# Patient Record
Sex: Female | Born: 1935 | Race: White | Hispanic: No | Smoking: Former smoker
Health system: Southern US, Community
[De-identification: ages and names within clinical notes are randomized; demographics above are authoritative.]

## PROBLEM LIST (undated history)

## (undated) DIAGNOSIS — Z9889 Other specified postprocedural states: Secondary | ICD-10-CM

## (undated) DIAGNOSIS — E039 Hypothyroidism, unspecified: Secondary | ICD-10-CM

## (undated) DIAGNOSIS — R112 Nausea with vomiting, unspecified: Secondary | ICD-10-CM

## (undated) DIAGNOSIS — E785 Hyperlipidemia, unspecified: Secondary | ICD-10-CM

## (undated) DIAGNOSIS — M810 Age-related osteoporosis without current pathological fracture: Secondary | ICD-10-CM

## (undated) HISTORY — DX: Hyperlipidemia, unspecified: E78.5

## (undated) HISTORY — DX: Age-related osteoporosis without current pathological fracture: M81.0

## (undated) HISTORY — PX: LAPAROTOMY: SHX154

## (undated) HISTORY — PX: ABDOMINAL HYSTERECTOMY: SHX81

## (undated) HISTORY — PX: HEMORROIDECTOMY: SUR656

## (undated) HISTORY — PX: SHOULDER ARTHROSCOPY: SHX128

## (undated) HISTORY — PX: OPEN REDUCTION INTERNAL FIXATION (ORIF) HAND: SHX5991

---

## 2000-10-09 ENCOUNTER — Other Ambulatory Visit: Admission: RE | Admit: 2000-10-09 | Discharge: 2000-10-09 | Payer: Self-pay | Admitting: Family Medicine

## 2000-10-17 ENCOUNTER — Ambulatory Visit (HOSPITAL_COMMUNITY): Admission: RE | Admit: 2000-10-17 | Discharge: 2000-10-17 | Payer: Self-pay | Admitting: Family Medicine

## 2000-10-17 ENCOUNTER — Encounter: Payer: Self-pay | Admitting: Family Medicine

## 2001-03-03 ENCOUNTER — Encounter: Payer: Self-pay | Admitting: Family Medicine

## 2001-03-03 ENCOUNTER — Ambulatory Visit (HOSPITAL_COMMUNITY): Admission: RE | Admit: 2001-03-03 | Discharge: 2001-03-03 | Payer: Self-pay | Admitting: Family Medicine

## 2001-03-23 ENCOUNTER — Emergency Department (HOSPITAL_COMMUNITY): Admission: EM | Admit: 2001-03-23 | Discharge: 2001-03-24 | Payer: Self-pay | Admitting: *Deleted

## 2001-06-26 ENCOUNTER — Encounter: Payer: Self-pay | Admitting: Family Medicine

## 2001-06-26 ENCOUNTER — Other Ambulatory Visit: Admission: RE | Admit: 2001-06-26 | Discharge: 2001-06-26 | Payer: Self-pay | Admitting: Family Medicine

## 2001-06-26 ENCOUNTER — Ambulatory Visit (HOSPITAL_COMMUNITY): Admission: RE | Admit: 2001-06-26 | Discharge: 2001-06-26 | Payer: Self-pay | Admitting: Family Medicine

## 2001-10-26 ENCOUNTER — Encounter: Payer: Self-pay | Admitting: Family Medicine

## 2001-10-26 ENCOUNTER — Ambulatory Visit (HOSPITAL_COMMUNITY): Admission: RE | Admit: 2001-10-26 | Discharge: 2001-10-26 | Payer: Self-pay | Admitting: Family Medicine

## 2002-03-03 ENCOUNTER — Encounter: Payer: Self-pay | Admitting: Family Medicine

## 2002-03-03 ENCOUNTER — Ambulatory Visit (HOSPITAL_COMMUNITY): Admission: RE | Admit: 2002-03-03 | Discharge: 2002-03-03 | Payer: Self-pay | Admitting: Family Medicine

## 2003-03-11 ENCOUNTER — Ambulatory Visit (HOSPITAL_COMMUNITY): Admission: RE | Admit: 2003-03-11 | Discharge: 2003-03-11 | Payer: Self-pay | Admitting: Family Medicine

## 2003-03-11 ENCOUNTER — Encounter: Payer: Self-pay | Admitting: Family Medicine

## 2003-06-05 ENCOUNTER — Emergency Department (HOSPITAL_COMMUNITY): Admission: EM | Admit: 2003-06-05 | Discharge: 2003-06-05 | Payer: Self-pay | Admitting: Internal Medicine

## 2004-03-19 ENCOUNTER — Ambulatory Visit (HOSPITAL_COMMUNITY): Admission: RE | Admit: 2004-03-19 | Discharge: 2004-03-19 | Payer: Self-pay | Admitting: Family Medicine

## 2004-07-10 ENCOUNTER — Ambulatory Visit (HOSPITAL_COMMUNITY): Admission: RE | Admit: 2004-07-10 | Discharge: 2004-07-10 | Payer: Self-pay | Admitting: Family Medicine

## 2005-03-25 ENCOUNTER — Ambulatory Visit (HOSPITAL_COMMUNITY): Admission: RE | Admit: 2005-03-25 | Discharge: 2005-03-25 | Payer: Self-pay | Admitting: Family Medicine

## 2005-04-05 ENCOUNTER — Ambulatory Visit (HOSPITAL_COMMUNITY): Admission: RE | Admit: 2005-04-05 | Discharge: 2005-04-05 | Payer: Self-pay | Admitting: Family Medicine

## 2005-06-27 ENCOUNTER — Ambulatory Visit (HOSPITAL_COMMUNITY): Admission: RE | Admit: 2005-06-27 | Discharge: 2005-06-27 | Payer: Self-pay | Admitting: Family Medicine

## 2006-03-28 ENCOUNTER — Ambulatory Visit (HOSPITAL_COMMUNITY): Admission: RE | Admit: 2006-03-28 | Discharge: 2006-03-28 | Payer: Self-pay | Admitting: Family Medicine

## 2006-07-25 ENCOUNTER — Ambulatory Visit (HOSPITAL_COMMUNITY): Admission: RE | Admit: 2006-07-25 | Discharge: 2006-07-25 | Payer: Self-pay | Admitting: Family Medicine

## 2007-03-31 ENCOUNTER — Ambulatory Visit (HOSPITAL_COMMUNITY): Admission: RE | Admit: 2007-03-31 | Discharge: 2007-03-31 | Payer: Self-pay | Admitting: Family Medicine

## 2008-04-22 ENCOUNTER — Ambulatory Visit (HOSPITAL_COMMUNITY): Admission: RE | Admit: 2008-04-22 | Discharge: 2008-04-22 | Payer: Self-pay | Admitting: Family Medicine

## 2009-05-01 ENCOUNTER — Ambulatory Visit (HOSPITAL_COMMUNITY): Admission: RE | Admit: 2009-05-01 | Discharge: 2009-05-01 | Payer: Self-pay | Admitting: Family Medicine

## 2009-08-22 ENCOUNTER — Ambulatory Visit (HOSPITAL_COMMUNITY): Admission: RE | Admit: 2009-08-22 | Discharge: 2009-08-22 | Payer: Self-pay | Admitting: Family Medicine

## 2009-09-01 ENCOUNTER — Ambulatory Visit (HOSPITAL_COMMUNITY)
Admission: RE | Admit: 2009-09-01 | Discharge: 2009-09-01 | Payer: Self-pay | Source: Home / Self Care | Admitting: Family Medicine

## 2009-09-06 ENCOUNTER — Ambulatory Visit: Payer: Self-pay | Admitting: Orthopedic Surgery

## 2009-09-06 DIAGNOSIS — M7710 Lateral epicondylitis, unspecified elbow: Secondary | ICD-10-CM | POA: Insufficient documentation

## 2009-09-06 DIAGNOSIS — M7580 Other shoulder lesions, unspecified shoulder: Secondary | ICD-10-CM

## 2009-09-06 DIAGNOSIS — M25529 Pain in unspecified elbow: Secondary | ICD-10-CM | POA: Insufficient documentation

## 2009-09-06 DIAGNOSIS — M25519 Pain in unspecified shoulder: Secondary | ICD-10-CM

## 2010-05-04 ENCOUNTER — Ambulatory Visit (HOSPITAL_COMMUNITY): Admission: RE | Admit: 2010-05-04 | Discharge: 2010-05-04 | Payer: Self-pay | Admitting: Family Medicine

## 2010-07-08 ENCOUNTER — Encounter: Payer: Self-pay | Admitting: Family Medicine

## 2010-07-17 NOTE — Letter (Signed)
Summary: History form  History form   Imported By: Jacklynn Ganong 09/13/2009 08:51:13  _____________________________________________________________________  External Attachment:    Type:   Image     Comment:   External Document

## 2010-07-17 NOTE — Medication Information (Signed)
Summary: Tax adviser   Imported By: Cammie Sickle 10/07/2009 12:21:32  _____________________________________________________________________  External Attachment:    Type:   Image     Comment:   External Document

## 2010-07-17 NOTE — Assessment & Plan Note (Signed)
Summary: LT FOREARM/ELBOW PAIN/NEEDS XRAYS/MEDICARE,GERBER/CAF   Vital Signs:  Patient profile:   75 year old female Height:      64 inches Weight:      168 pounds Pulse rate:   72 / minute Resp:     16 per minute  Vitals Entered By: Fuller Canada MD (September 06, 2009 10:15 AM)  Visit Type:  new patient Referring Provider:  self Primary Provider:  Dr. Renard Matter  CC:  left arm pain.  History of Present Illness: 75 years old LEFT arm and shoulder pain status post O. TIF of the wrist in 1980 present for 3 weeks of pain in the LEFT shoulder and elbow and generally in the LEFT arm.  She describes sharp throbbing constant severe pain an 8/10 grade which came on suddenly after some yard work   Will have xrays today.  Meds: Evista, Cretor, Vicodin as needed.    Allergies (verified): No Known Drug Allergies  Past History:  Past Medical History: OA cholesterol holes in ear drums  Past Surgical History: left arm broken right arm frozen shoulder cholesterol  Family History: na  Social History: Patient is divorced.  Patient is widowed.  no smoking no alcohol 3 cups of caffeine per day  Review of Systems Constitutional:  Denies weight loss, weight gain, fever, chills, and fatigue. Cardiovascular:  Denies chest pain, palpitations, fainting, and murmurs. Respiratory:  Complains of snoring; denies short of breath, wheezing, couch, tightness, pain on inspiration, and snoring . Gastrointestinal:  Complains of heartburn; denies nausea, vomiting, diarrhea, constipation, and blood in your stools. Genitourinary:  Denies frequency, urgency, difficulty urinating, painful urination, flank pain, and bleeding in urine. Neurologic:  Denies numbness, tingling, unsteady gait, dizziness, tremors, and seizure. Musculoskeletal:  Complains of stiffness and muscle pain; denies joint pain, swelling, instability, redness, and heat. Endocrine:  Denies excessive thirst, exessive urination, and  heat or cold intolerance. Psychiatric:  Denies nervousness, depression, anxiety, and hallucinations. Skin:  Denies changes in the skin, poor healing, rash, itching, and redness. HEENT:  Denies blurred or double vision, eye pain, redness, and watering; ear aches. Immunology:  Denies seasonal allergies, sinus problems, and allergic to bee stings. Hemoatologic:  Denies easy bleeding and brusing.  Physical Exam  Additional Exam:   VS reviewed and were normal  GEN: appearance was normal   CDV: normal pulses temperature and no edema  LYMPH nodes were normal   SKIN was normal   Neuro: normal sensation Psyche: AAO x 3 and mood was normal   MSK *Gait was normal  *Inspection LEFT arm and shoulder and elbow all examined at the same time.  There is tenderness over the lateral epicondyle of the LEFT elbow.  There is pain with supination against resistance.  There is mild impingement sign.  There is no tenderness around the shoulder.  Range of motion shoulder elbow wrist normal  Strength normal  Shoulder elbow stable wrist stable  No tenderness over the incision of the LEFT forearm.    Impression & Recommendations:  Problem # 1:  IMPINGEMENT SYNDROME (ICD-726.2)  Orders: New Patient Level III (60454) Shoulder x-ray,  minimum 2 views (09811)  Problem # 2:  SHOULDER PAIN (ICD-719.41)  2 views LEFT shoulder.  Glenohumeral joint looks relatively normal.  She does have some humeral proximal migration which I think is secondary to pain and angle of the x-ray.  Acromion looks okay  Impression normal shoulder.  Orders: New Patient Level III (91478) Shoulder x-ray,  minimum 2 views (29562)  Problem # 3:  LATERAL EPICONDYLITIS (ICD-726.32)  Orders: New Patient Level III (16109)  Problem # 4:  ELBOW PAIN, LEFT (ICD-719.42)  she has some lateral epicondylitis which seems to be the primary area of maximal tenderness and she has a mild shoulder impingement  Recommend 12 a Dosepak  followed by ibuprofen 800 mg t.i.d. for 16 days followup one month  Orders: New Patient Level III (60454)  Patient Instructions: 1)  take medication,  rest ! 2)  Please schedule a follow-up appointment in 1 month.

## 2010-10-23 ENCOUNTER — Other Ambulatory Visit (HOSPITAL_COMMUNITY): Payer: Self-pay | Admitting: Family Medicine

## 2010-10-23 ENCOUNTER — Ambulatory Visit (HOSPITAL_COMMUNITY)
Admission: RE | Admit: 2010-10-23 | Discharge: 2010-10-23 | Disposition: A | Discharge status: Home / Self Care | Payer: Medicare Other | Source: Ambulatory Visit | Attending: Family Medicine | Admitting: Family Medicine

## 2010-10-23 DIAGNOSIS — M79609 Pain in unspecified limb: Secondary | ICD-10-CM | POA: Insufficient documentation

## 2010-10-23 DIAGNOSIS — M79641 Pain in right hand: Secondary | ICD-10-CM

## 2011-03-26 ENCOUNTER — Other Ambulatory Visit (HOSPITAL_COMMUNITY): Payer: Self-pay | Admitting: Family Medicine

## 2011-03-26 DIAGNOSIS — Z139 Encounter for screening, unspecified: Secondary | ICD-10-CM

## 2011-05-10 ENCOUNTER — Ambulatory Visit (HOSPITAL_COMMUNITY)
Admission: RE | Admit: 2011-05-10 | Discharge: 2011-05-10 | Disposition: A | Discharge status: Home / Self Care | Payer: Medicare Other | Source: Ambulatory Visit | Attending: Family Medicine | Admitting: Family Medicine

## 2011-05-10 DIAGNOSIS — Z1231 Encounter for screening mammogram for malignant neoplasm of breast: Secondary | ICD-10-CM | POA: Insufficient documentation

## 2011-05-10 DIAGNOSIS — Z139 Encounter for screening, unspecified: Secondary | ICD-10-CM

## 2012-03-10 ENCOUNTER — Other Ambulatory Visit (HOSPITAL_COMMUNITY): Payer: Self-pay | Admitting: Family Medicine

## 2012-03-10 DIAGNOSIS — M810 Age-related osteoporosis without current pathological fracture: Secondary | ICD-10-CM

## 2012-03-17 ENCOUNTER — Other Ambulatory Visit (HOSPITAL_COMMUNITY): Payer: 59

## 2012-04-08 ENCOUNTER — Other Ambulatory Visit (HOSPITAL_COMMUNITY): Payer: Self-pay | Admitting: Family Medicine

## 2012-04-08 DIAGNOSIS — Z139 Encounter for screening, unspecified: Secondary | ICD-10-CM

## 2012-04-08 DIAGNOSIS — IMO0001 Reserved for inherently not codable concepts without codable children: Secondary | ICD-10-CM

## 2012-05-11 ENCOUNTER — Inpatient Hospital Stay (HOSPITAL_COMMUNITY): Admission: RE | Admit: 2012-05-11 | Payer: 59 | Source: Ambulatory Visit

## 2012-05-22 ENCOUNTER — Ambulatory Visit (HOSPITAL_COMMUNITY): Payer: 59

## 2012-05-29 ENCOUNTER — Ambulatory Visit (HOSPITAL_COMMUNITY): Payer: 59

## 2012-06-12 ENCOUNTER — Ambulatory Visit (HOSPITAL_COMMUNITY): Payer: 59

## 2012-06-15 ENCOUNTER — Ambulatory Visit (HOSPITAL_COMMUNITY): Payer: 59

## 2012-09-21 ENCOUNTER — Ambulatory Visit (HOSPITAL_COMMUNITY)
Admission: RE | Admit: 2012-09-21 | Discharge: 2012-09-21 | Disposition: A | Discharge status: Home / Self Care | Payer: Medicare Other | Source: Ambulatory Visit | Attending: Family Medicine | Admitting: Family Medicine

## 2012-09-21 DIAGNOSIS — Z139 Encounter for screening, unspecified: Secondary | ICD-10-CM

## 2012-09-21 DIAGNOSIS — Z1231 Encounter for screening mammogram for malignant neoplasm of breast: Secondary | ICD-10-CM | POA: Insufficient documentation

## 2012-11-11 ENCOUNTER — Encounter: Payer: Self-pay | Admitting: Obstetrics & Gynecology

## 2012-11-11 ENCOUNTER — Ambulatory Visit (INDEPENDENT_AMBULATORY_CARE_PROVIDER_SITE_OTHER): Payer: Medicare Other | Admitting: Obstetrics & Gynecology

## 2012-11-11 VITALS — BP 150/80 | Ht 62.0 in | Wt 155.0 lb

## 2012-11-11 DIAGNOSIS — K469 Unspecified abdominal hernia without obstruction or gangrene: Secondary | ICD-10-CM | POA: Insufficient documentation

## 2012-11-11 DIAGNOSIS — N815 Vaginal enterocele: Secondary | ICD-10-CM

## 2012-11-11 NOTE — Progress Notes (Signed)
Patient ID: Michelle Bradford, female   DOB: Dec 21, 1935, 77 y.o.   MRN: [id number] Referred from Dr Megan Mans  "feels like something falling out" worse for some time  Exam Enterocoele moderate, good vaginal apex support Good bladder and bladder neck and rectal support Short vagina Obviously post menopausal atrophy  Milex ring with support fitted  Follow up in 1 week

## 2012-11-23 ENCOUNTER — Encounter: Payer: Self-pay | Admitting: Obstetrics & Gynecology

## 2012-11-23 ENCOUNTER — Ambulatory Visit (INDEPENDENT_AMBULATORY_CARE_PROVIDER_SITE_OTHER): Payer: Medicare Other | Admitting: Obstetrics & Gynecology

## 2012-11-23 VITALS — BP 150/80 | Wt 150.0 lb

## 2012-11-23 DIAGNOSIS — K469 Unspecified abdominal hernia without obstruction or gangrene: Secondary | ICD-10-CM

## 2012-11-23 DIAGNOSIS — N815 Vaginal enterocele: Secondary | ICD-10-CM

## 2012-11-23 NOTE — Progress Notes (Signed)
Patient ID: Michelle Bradford, female   DOB: 08-Dec-1935, 77 y.o.   MRN: [id number] Milex ring with support 2 1/2 inches #3 placed for patient

## 2012-12-24 ENCOUNTER — Ambulatory Visit: Payer: Medicare Other | Admitting: Obstetrics & Gynecology

## 2012-12-29 ENCOUNTER — Ambulatory Visit: Payer: Medicare Other | Admitting: Obstetrics & Gynecology

## 2013-01-05 ENCOUNTER — Encounter: Payer: Self-pay | Admitting: Obstetrics & Gynecology

## 2013-01-05 ENCOUNTER — Ambulatory Visit (INDEPENDENT_AMBULATORY_CARE_PROVIDER_SITE_OTHER): Payer: Medicare Other | Admitting: Obstetrics & Gynecology

## 2013-01-05 VITALS — BP 160/80 | Wt 154.0 lb

## 2013-01-05 DIAGNOSIS — K469 Unspecified abdominal hernia without obstruction or gangrene: Secondary | ICD-10-CM

## 2013-01-05 DIAGNOSIS — N815 Vaginal enterocele: Secondary | ICD-10-CM

## 2013-01-05 MED ORDER — NYSTATIN-TRIAMCINOLONE 100000-0.1 UNIT/GM-% EX OINT: TOPICAL_OINTMENT | Freq: Two times a day (BID) | CUTANEOUS | Status: DC

## 2013-01-05 NOTE — Progress Notes (Signed)
Patient ID: Michelle Bradford, female   DOB: 09-10-1935, 77 y.o.   MRN: [id number] Pt having adverse symptoms with the pessary It is not a great fit for her due to short vagina  Removed and not replaced  Follow up in 6 months  GV placed Mytrex Rx

## 2013-01-05 NOTE — Patient Instructions (Signed)
Yeast Infection of the Skin Some yeast on the skin is normal, but sometimes it causes an infection. If you have a yeast infection, it shows up as white or light brown patches on brown skin. You can see it better in the summer on tan skin. It causes light-colored holes in your suntan. It can happen on any area of the body. This cannot be passed from person to person. HOME CARE  Scrub your skin daily with a dandruff shampoo. Your rash may take a couple weeks to get well.  Do not scratch or itch the rash. GET HELP RIGHT AWAY IF:   You get another infection from scratching. The skin may get warm, red, and may ooze fluid.  The infection does not seem to be getting better. MAKE SURE YOU:  Understand these instructions.  Will watch your condition.  Will get help right away if you are not doing well or get worse. Document Released: 05/16/2008 Document Revised: 08/26/2011 Document Reviewed: 05/16/2008 Exit[employer] Patient Information 2014 [employer].  

## 2013-02-16 ENCOUNTER — Ambulatory Visit: Payer: Medicare Other | Admitting: Obstetrics & Gynecology

## 2013-05-20 ENCOUNTER — Emergency Department (HOSPITAL_COMMUNITY)
Admission: EM | Admit: 2013-05-20 | Discharge: 2013-05-20 | Disposition: A | Discharge status: Home / Self Care | Payer: Medicare Other | Attending: Emergency Medicine | Admitting: Emergency Medicine

## 2013-05-20 ENCOUNTER — Encounter (HOSPITAL_COMMUNITY): Payer: Self-pay | Admitting: Emergency Medicine

## 2013-05-20 ENCOUNTER — Emergency Department (HOSPITAL_COMMUNITY): Payer: Medicare Other

## 2013-05-20 DIAGNOSIS — M810 Age-related osteoporosis without current pathological fracture: Secondary | ICD-10-CM | POA: Insufficient documentation

## 2013-05-20 DIAGNOSIS — Y92009 Unspecified place in unspecified non-institutional (private) residence as the place of occurrence of the external cause: Secondary | ICD-10-CM | POA: Insufficient documentation

## 2013-05-20 DIAGNOSIS — W19XXXA Unspecified fall, initial encounter: Secondary | ICD-10-CM

## 2013-05-20 DIAGNOSIS — S0101XA Laceration without foreign body of scalp, initial encounter: Secondary | ICD-10-CM

## 2013-05-20 DIAGNOSIS — W1809XA Striking against other object with subsequent fall, initial encounter: Secondary | ICD-10-CM | POA: Insufficient documentation

## 2013-05-20 DIAGNOSIS — S0100XA Unspecified open wound of scalp, initial encounter: Secondary | ICD-10-CM | POA: Insufficient documentation

## 2013-05-20 DIAGNOSIS — Z23 Encounter for immunization: Secondary | ICD-10-CM | POA: Insufficient documentation

## 2013-05-20 DIAGNOSIS — Z79899 Other long term (current) drug therapy: Secondary | ICD-10-CM | POA: Insufficient documentation

## 2013-05-20 DIAGNOSIS — Y9389 Activity, other specified: Secondary | ICD-10-CM | POA: Insufficient documentation

## 2013-05-20 DIAGNOSIS — Z87891 Personal history of nicotine dependence: Secondary | ICD-10-CM | POA: Insufficient documentation

## 2013-05-20 DIAGNOSIS — IMO0002 Reserved for concepts with insufficient information to code with codable children: Secondary | ICD-10-CM | POA: Insufficient documentation

## 2013-05-20 DIAGNOSIS — E785 Hyperlipidemia, unspecified: Secondary | ICD-10-CM | POA: Insufficient documentation

## 2013-05-20 DIAGNOSIS — W230XXA Caught, crushed, jammed, or pinched between moving objects, initial encounter: Secondary | ICD-10-CM | POA: Insufficient documentation

## 2013-05-20 MED ORDER — ACETAMINOPHEN 325 MG PO TABS
650.0000 mg | ORAL_TABLET | Freq: Once | ORAL | Status: AC
  Administered 2013-05-20: 650 mg via ORAL
  Filled 2013-05-20: qty 2

## 2013-05-20 MED ORDER — TETANUS-DIPHTH-ACELL PERTUSSIS 5-2.5-18.5 LF-MCG/0.5 IM SUSP
0.5000 mL | Freq: Once | INTRAMUSCULAR | Status: AC
  Administered 2013-05-20: 0.5 mL via INTRAMUSCULAR
  Filled 2013-05-20: qty 0.5

## 2013-05-20 MED ORDER — LIDOCAINE-EPINEPHRINE-TETRACAINE (LET) SOLUTION
3.0000 mL | Freq: Once | NASAL | Status: AC
  Administered 2013-05-20: 3 mL via TOPICAL
  Filled 2013-05-20: qty 3

## 2013-05-20 NOTE — ED Provider Notes (Signed)
CSN: [id number]     Arrival date & time 05/20/13  0350 History   First MD Initiated Contact with Patient 05/20/13 [phone]     Chief Complaint  Patient presents with  . 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 She states she had gotten up to go to the bathroom and she caught her foot underneath the bed and fell between her bed and the TV and hit her head on the TV stand. She denies loss of consciousness. She states she did have nausea initially but it's gone now. She had no vomiting. She denies any blurred double vision. She denies any numbness or tingling in her extremities. She denies injuring anything else and specifically denies neck pain, back pain, chest pain, abdominal pain, or extremity pain. Patient denies taking blood thinners. She states she takes a baby aspirin now and then but not on a regular basis.She c/o a headache in the area where she hit her head.   Last tetanus years ago  PCP Dr Renard Matter  Past Medical History  Diagnosis Date  . Hyperlipidemia   . Osteoporosis    Past Surgical History  Procedure Laterality Date  . Abdominal hysterectomy     History reviewed. No pertinent family history. History  Substance Use Topics  . Smoking status: Former Games developer  . Smokeless tobacco: Not on file  . Alcohol Use: No   Lives at home Lives with a roommate   OB History   Grav Para Term Preterm Abortions TAB SAB Ect Mult Living                 Review of Systems  All other systems reviewed and are negative.    Allergies  Review of patient's allergies indicates no known allergies.  Home Medications   Current Outpatient Rx  Name  Route  Sig  Dispense  Refill  . HYDROcodone-acetaminophen (NORCO/VICODIN) 5-325 MG per tablet   Oral   Take 1 tablet by mouth every 4 (four) hours.         Marland Kitchen nystatin-triamcinolone  ointment (MYCOLOG)   Topical   Apply topically 2 (two) times daily.   30 g   11   . raloxifene (EVISTA) 60 MG tablet   Oral   Take 60 mg by mouth daily.         . rosuvastatin (CRESTOR) 20 MG tablet   Oral   Take 20 mg by mouth daily.          BP 155/73  Pulse 66  Temp(Src) 98 F (36.7 C) (Oral)  Resp 16  Ht 5\' 2"  (1.575 m)  Wt 149 lb (67.586 kg)  BMI 27.25 kg/m2  SpO2 98%  Vital signs normal   Physical Exam  Nursing note and vitals reviewed. Constitutional: She is oriented to person, place, and time. She appears well-developed and well-nourished.  Non-toxic appearance. She does not appear ill. No distress.  HENT:  Head: Normocephalic.    Right Ear: External ear normal.  Left Ear: External ear normal.  Nose: Nose normal. No mucosal edema or rhinorrhea.  Mouth/Throat: Oropharynx is clear and moist and mucous membranes are normal. No dental abscesses or uvula swelling.  Patient has a 1 cm laceration of her left posterior scalp. He has mild swelling in that area. There is no crepitance noted.  Eyes: Conjunctivae and EOM are normal. Pupils are equal, round, and reactive to  light.  Neck: Normal range of motion and full passive range of motion without pain. Neck supple.  Nontender cervical spine to palpation  Pulmonary/Chest: Effort normal. No respiratory distress. She has no rhonchi. She exhibits no tenderness and no crepitus.  Abdominal: Soft. Normal appearance. There is no tenderness.  Musculoskeletal: Normal range of motion. She exhibits no edema and no tenderness.  Moves all extremities well. Nontender thoracic and lumbar spine  Neurological: She is alert and oriented to person, place, and time. She has normal strength. No cranial nerve deficit.  Skin: Skin is warm, dry and intact. No rash noted. No erythema. No pallor.  Psychiatric: She has a normal mood and affect. Her speech is normal and behavior is normal. Her mood appears not anxious.    ED Course    Procedures (including critical care time)  Medications  acetaminophen (TYLENOL) tablet 650 mg (not administered)  lidocaine-EPINEPHrine-tetracaine (LET) solution (3 mLs Topical Given 05/20/13 0442)  Tdap (BOOSTRIX) injection 0.5 mL (0.5 mLs Intramuscular Given 05/20/13 0442)   LACERATION REPAIR Performed by: Devoria Albe L Authorized by: Ward Givens Consent: Verbal consent obtained. Risks and benefits: risks, benefits and alternatives were discussed Consent given by: patient Patient identity confirmed: provided demographic data Prepped and Draped in normal sterile fashion Wound explored   Laceration Location: Left posterior scalp  Laceration Length: 1 cm  No Foreign Bodies seen or palpated   Local anesthetic:LET  Anesthetic total: 3 ml  Amount of cleaning: standard  Skin closure: staples  Number of sutures: 2   Patient tolerance: Patient tolerated the procedure well with no immediate complications.    Labs Review Labs Reviewed - No data to display Imaging Review Ct Head Wo Contrast  05/20/2013   CLINICAL DATA:  Fall, head trauma, left posterior laceration  EXAM: CT HEAD WITHOUT CONTRAST  TECHNIQUE: Contiguous axial images were obtained from the base of the skull through the vertex without intravenous contrast.  COMPARISON:  None  FINDINGS: Subcortical and periventricular white matter hypodensities are noted, a nonspecific finding most often seen in the setting of chronic microangiopathic change. No CT evidence of an acute infarction. No intraparenchymal hemorrhage, mass, mass effect, or abnormal extra-axial fluid collection. The ventricles, cisterns, and sulci are normal in size, shape, and position. The visualized paranasal sinuses predominantly clear. Partially opacified left greater than right mastoid air cells. There is left posterior scalp swelling and laceration as seen on image 44/60. No underlying calvarial fracture. No radiopaque foreign body.  IMPRESSION: Left  posterolateral scalp swelling/ laceration. No underlying calvarial fracture or acute intracranial abnormality.  White matter changes as above, a nonspecific finding often seen in the setting of chronic microangiopathic change.   Electronically Signed   By: Jearld Lesch M.D.   On: 05/20/2013 05:12    EKG Interpretation   None       MDM   1. Fall at home, initial encounter   2. Laceration of scalp, initial encounter     Plan discharge   Devoria Albe, MD, Franz Dell, MD 05/20/13 [phone]

## 2013-05-20 NOTE — ED Notes (Signed)
Pt reports tripping over foot of her bed and striking her head when she fell.  Laceration noted to back of head.  Bleeding controlled at this time.  Pt also c/o mild pain in left side.

## 2013-05-20 NOTE — ED Notes (Signed)
Wound cleaned.  Mild hematoma noted.

## 2013-09-07 ENCOUNTER — Other Ambulatory Visit (HOSPITAL_COMMUNITY): Payer: Self-pay | Admitting: Family Medicine

## 2013-09-07 DIAGNOSIS — Z1231 Encounter for screening mammogram for malignant neoplasm of breast: Secondary | ICD-10-CM

## 2013-09-27 ENCOUNTER — Ambulatory Visit (HOSPITAL_COMMUNITY)
Admission: RE | Admit: 2013-09-27 | Discharge: 2013-09-27 | Disposition: A | Discharge status: Home / Self Care | Payer: Medicare Other | Source: Ambulatory Visit | Attending: Family Medicine | Admitting: Family Medicine

## 2013-09-27 DIAGNOSIS — Z1231 Encounter for screening mammogram for malignant neoplasm of breast: Secondary | ICD-10-CM

## 2014-03-21 ENCOUNTER — Other Ambulatory Visit (HOSPITAL_COMMUNITY): Payer: Self-pay | Admitting: Family Medicine

## 2014-03-21 ENCOUNTER — Ambulatory Visit (HOSPITAL_COMMUNITY)
Admission: RE | Admit: 2014-03-21 | Discharge: 2014-03-21 | Disposition: A | Discharge status: Home / Self Care | Payer: Medicare Other | Source: Ambulatory Visit | Attending: Family Medicine | Admitting: Family Medicine

## 2014-03-21 DIAGNOSIS — R509 Fever, unspecified: Secondary | ICD-10-CM | POA: Diagnosis not present

## 2014-03-21 DIAGNOSIS — R05 Cough: Secondary | ICD-10-CM

## 2014-03-21 DIAGNOSIS — R110 Nausea: Secondary | ICD-10-CM | POA: Insufficient documentation

## 2014-03-21 DIAGNOSIS — J439 Emphysema, unspecified: Secondary | ICD-10-CM | POA: Diagnosis not present

## 2014-03-21 DIAGNOSIS — R059 Cough, unspecified: Secondary | ICD-10-CM

## 2014-08-16 ENCOUNTER — Ambulatory Visit (INDEPENDENT_AMBULATORY_CARE_PROVIDER_SITE_OTHER): Payer: Medicare Other | Admitting: Orthopedic Surgery

## 2014-08-16 ENCOUNTER — Ambulatory Visit (INDEPENDENT_AMBULATORY_CARE_PROVIDER_SITE_OTHER): Payer: Medicare Other

## 2014-08-16 VITALS — BP 140/78 | Ht 62.0 in | Wt 157.0 lb

## 2014-08-16 DIAGNOSIS — M75101 Unspecified rotator cuff tear or rupture of right shoulder, not specified as traumatic: Secondary | ICD-10-CM

## 2014-08-16 DIAGNOSIS — M25511 Pain in right shoulder: Secondary | ICD-10-CM

## 2014-08-16 MED ORDER — ACETAMINOPHEN-CODEINE #3 300-30 MG PO TABS: 1.0000 | ORAL_TABLET | ORAL | Status: DC | PRN

## 2014-08-16 NOTE — Progress Notes (Signed)
  Patient ID: Michelle Bradford, female   DOB: [birth year], 80 y.o.   MRN: [id number] Chief Complaint  Patient presents with  . Shoulder Pain    right shoulder pain radiates down arm to hand, no known injury    History the patient presents with a four-week history of painful right shoulder with painful [place] elevation painful activities of daily living. This has "locked she feels that there is popping going on the pain radiates into her forearm and is worse with forward elevation. She had a history of surgery for frozen circumflex shoulders back and the 90s. Pain is 8 out of 10 no previous treatment  She says she's not having any other problems in her review of systems she reported as normal. She has a medical history of osteoporosis and thyroid disease  She listed no previous surgeries  Medications include 40 mg of simvastatin, 60 mg evista, and Synthroid. No allergies.  Family history of thyroid disease and osteoporosis in her social history revealedsmoking drinking or drug use  BP 140/78 mmHg  Ht 5\' 2"  (1.575 m)  Wt 157 lb (71.215 kg)  BMI 28.71 kg/m2 The patient is well-groomed. She is oriented 3. Her mood and affect are normal. She walks normally.  Her left shoulder has full range of motion negative apprehension normal strength. No tenderness or swelling skin is normal pulses are intact sensation is normal in the axilla and supraclavicular regions have no positive lymph nodes  The right shoulder has painful range of motion decreased range of motion. Acromial and bicipital groove tenderness. Apprehension negative cuff strength remains normal skin intact pulses good sensation normal lymph nodes negative  X-rays show no fracture or dislocation no bone lesions  Impression rotator cuff syndrome  Recommend injection subacromial space   Procedure note the subacromial injection shoulder RIGHT  Verbal consent was obtained to inject the  RIGHT   Shoulder  Timeout was completed to confirm the  injection site is a subacromial space of the  RIGHT  shoulder   Medication used Depo-Medrol 40 mg and lidocaine 1% 3 cc  Anesthesia was provided by ethyl chloride  The injection was performed in the RIGHT  posterior subacromial space. After pinning the skin with alcohol and anesthetized the skin with ethyl chloride the subacromial space was injected using a 20-gauge needle. There were no complications  Sterile dressing was applied.

## 2014-08-16 NOTE — Patient Instructions (Signed)
Home exercises   Joint Injection Care After Refer to this sheet in the next few days. These instructions provide you with information on caring for yourself after you have had a joint injection. Your caregiver also may give you more specific instructions. Your treatment has been planned according to current medical practices, but problems sometimes occur. Call your caregiver if you have any problems or questions after your procedure. After any type of joint injection, it is not uncommon to experience:  Soreness, swelling, or bruising around the injection site.  Mild numbness, tingling, or weakness around the injection site caused by the numbing medicine used before or with the injection. It also is possible to experience the following effects associated with the specific agent after injection:  Iodine-based contrast agents:  Allergic reaction (itching, hives, widespread redness, and swelling beyond the injection site).  Corticosteroids (These effects are rare.):  Allergic reaction.  Increased blood sugar levels (If you have diabetes and you notice that your blood sugar levels have increased, notify your caregiver).  Increased blood pressure levels.  Mood swings.  Hyaluronic acid in the use of viscosupplementation.  Temporary heat or redness.  Temporary rash and itching.  Increased fluid accumulation in the injected joint. These effects all should resolve within a day after your procedure.  HOME CARE INSTRUCTIONS  Limit yourself to light activity the day of your procedure. Avoid lifting heavy objects, bending, stooping, or twisting.  Take prescription or over-the-counter pain medication as directed by your caregiver.  You may apply ice to your injection site to reduce pain and swelling the day of your procedure. Ice may be applied 03-04 times:  Put ice in a plastic bag.  Place a towel between your skin and the bag.  Leave the ice on for no longer than 15-20 minutes each  time. SEEK IMMEDIATE MEDICAL CARE IF:   Pain and swelling get worse rather than better or extend beyond the injection site.  Numbness does not go away.  Blood or fluid continues to leak from the injection site.  You have chest pain.  You have swelling of your face or tongue.  You have trouble breathing or you become dizzy.  You develop a fever, chills, or severe tenderness at the injection site that last longer than 1 day. MAKE SURE YOU:  Understand these instructions.  Watch your condition.  Get help right away if you are not doing well or if you get worse. Document Released: 02/14/2011 Document Revised: 08/26/2011 Document Reviewed: 02/14/2011 Exit[employer] Patient Information 2015 [employer]. This information is not intended to replace advice given to you by your health care provider. Make sure you discuss any questions you have with your health care provider.  

## 2014-08-30 ENCOUNTER — Other Ambulatory Visit (HOSPITAL_COMMUNITY): Payer: Self-pay | Admitting: Family Medicine

## 2014-08-30 DIAGNOSIS — Z1231 Encounter for screening mammogram for malignant neoplasm of breast: Secondary | ICD-10-CM

## 2014-09-28 ENCOUNTER — Telehealth: Payer: Self-pay | Admitting: Orthopedic Surgery

## 2014-09-28 NOTE — Telephone Encounter (Signed)
Patient called to relay that her shoulder is not yet better, following injection 08/16/14.  I have scheduled her another appointment, first available, 10/25/14.  Please advise of any recommendations.  Patient [phone]

## 2014-09-29 NOTE — Telephone Encounter (Signed)
GOOD

## 2014-09-29 NOTE — Telephone Encounter (Signed)
Routing to Dr Harrison 

## 2014-10-21 ENCOUNTER — Ambulatory Visit (HOSPITAL_COMMUNITY)
Admission: RE | Admit: 2014-10-21 | Discharge: 2014-10-21 | Disposition: A | Discharge status: Home / Self Care | Payer: Medicare Other | Source: Ambulatory Visit | Attending: Family Medicine | Admitting: Family Medicine

## 2014-10-21 DIAGNOSIS — Z1231 Encounter for screening mammogram for malignant neoplasm of breast: Secondary | ICD-10-CM | POA: Diagnosis not present

## 2014-10-25 ENCOUNTER — Encounter: Payer: Self-pay | Admitting: Orthopedic Surgery

## 2014-10-25 ENCOUNTER — Ambulatory Visit (INDEPENDENT_AMBULATORY_CARE_PROVIDER_SITE_OTHER): Payer: Medicare Other | Admitting: Orthopedic Surgery

## 2014-10-25 VITALS — BP 142/71 | Ht 62.0 in | Wt 157.0 lb

## 2014-10-25 DIAGNOSIS — M75101 Unspecified rotator cuff tear or rupture of right shoulder, not specified as traumatic: Secondary | ICD-10-CM

## 2014-10-25 NOTE — Progress Notes (Signed)
Patient ID: Michelle Bradford, female   DOB: 1936-01-01, 79 y.o.   MRN: [id number] Recheck visit  Chief Complaint  Patient presents with  . Shoulder Pain    recurring Right shoulder pain    Status post injection home exercise program for presumed rotator cuff syndrome got initial relief of pain, then pain started to come back  Patient is a clicking popping sound with weakness when she extends her arm away from her body. She says she can't lift anything heavy  System review neurologic symptoms are denied neck pain denied  Exam she does have some tenderness in her trapezius but most of her pain and tenderness are in the rotator cuff area of the shoulder including the deltoid anterior interval rotator interval and anterior joint line, some pain in the posterior subacromial space. Weakness and painful drop test stability tests are normal scans intact good pulses normal sensation lymph nodes are negative  Recommend MRI to assess her rotator cuff tear for surgery orders she does need decompression  Repeat injection  Procedure note the subacromial injection shoulder RIGHT  Verbal consent was obtained to inject the  RIGHT   Shoulder  Timeout was completed to confirm the injection site is a subacromial space of the  RIGHT  shoulder   Medication used Depo-Medrol 40 mg and lidocaine 1% 3 cc  Anesthesia was provided by ethyl chloride  The injection was performed in the RIGHT  posterior subacromial space. After pinning the skin with alcohol and anesthetized the skin with ethyl chloride the subacromial space was injected using a 20-gauge needle. There were no complications  Sterile dressing was applied.   Note addendum patient wants to hold on MRI until she sees the results of this injection, call us if pain persists

## 2014-10-25 NOTE — Patient Instructions (Signed)
If injection does not help shoulder pain, call office and we will order MRI

## 2014-11-15 ENCOUNTER — Telehealth: Payer: Self-pay | Admitting: Orthopedic Surgery

## 2014-11-15 NOTE — Telephone Encounter (Signed)
MRI RIGHT SHOULDER   ROTATOR CUFF TEAR

## 2014-11-15 NOTE — Telephone Encounter (Signed)
Patient spoke with Dr Aline Brochure, states right shoulder is not any better after 2nd injection (at visit 10/25/14) -- states will probably need to move forward with MRI.  Last office note indicates: "Note: addendum patient wants to hold on MRI until she sees the results of this injection, call us if pain persists"  -  Patient also states she will need OPEN MRI. Please advise.  Ph# is [phone].

## 2014-11-16 ENCOUNTER — Other Ambulatory Visit: Payer: Self-pay | Admitting: *Deleted

## 2014-11-16 DIAGNOSIS — M75101 Unspecified rotator cuff tear or rupture of right shoulder, not specified as traumatic: Secondary | ICD-10-CM

## 2014-11-16 NOTE — Telephone Encounter (Signed)
PATIENT AWARE

## 2014-11-16 NOTE — Telephone Encounter (Signed)
MRI ORDERED  CALLED PATIENT, NO ANSWER, LEFT VM

## 2014-11-29 ENCOUNTER — Ambulatory Visit (HOSPITAL_COMMUNITY): Payer: Medicare Other

## 2015-04-25 NOTE — Patient Instructions (Signed)
Michelle Bradford  04/25/2015     @PREFPERIOPPHARMACY @   Your procedure is scheduled on 05/01/2015.  Report to [facility] at 6:15 A.M.  Call this number if you have problems the morning of surgery:  [phone]   Remember:  Do not eat food or drink liquids after midnight.  Take these medicines the morning of surgery with A SIP OF WATER Synthroid   Do not wear jewelry, make-up or nail polish.  Do not wear lotions, powders, or perfumes.  You may wear deodorant.  Do not shave 48 hours prior to surgery.  Men may shave face and neck.  Do not bring valuables to the hospital.  [facility] is not responsible for any belongings or valuables.  Contacts, dentures or bridgework may not be worn into surgery.  Leave your suitcase in the car.  After surgery it may be brought to your room.  For patients admitted to the hospital, discharge time will be determined by your treatment team.  Patients discharged the day of surgery will not be allowed to drive home.   Please read over the following fact sheets that you were given. Anesthesia Post-op Instructions     PATIENT INSTRUCTIONS POST-ANESTHESIA  IMMEDIATELY FOLLOWING SURGERY:  Do not drive or operate machinery for the first twenty four hours after surgery.  Do not make any important decisions for twenty four hours after surgery or while taking narcotic pain medications or sedatives.  If you develop intractable nausea and vomiting or a severe headache please notify your doctor immediately.  FOLLOW-UP:  Please make an appointment with your surgeon as instructed. You do not need to follow up with anesthesia unless specifically instructed to do so.  WOUND CARE INSTRUCTIONS (if applicable):  Keep a dry clean dressing on the anesthesia/puncture wound site if there is drainage.  Once the wound has quit draining you may leave it open to air.  Generally you should leave the bandage intact for twenty four hours unless there is drainage.  If the epidural  site drains for more than 36-48 hours please call the anesthesia department.  QUESTIONS?:  Please feel free to call your physician or the hospital operator if you have any questions, and they will be happy to assist you.       A cataract is a clouding of the lens of the eye. When a lens becomes cloudy, vision is reduced based on the degree and nature of the clouding. Surgery may be needed to improve vision. Surgery removes the cloudy lens and usually replaces it with a substitute lens (intraocular lens, IOL). LET YOUR EYE DOCTOR KNOW ABOUT:  Allergies to food or medicine.  Medicines taken including herbs, eye drops, over-the-counter medicines, and creams.  Use of steroids (by mouth or creams).  Previous problems with anesthetics or numbing medicine.  History of bleeding problems or blood clots.  Previous surgery.  Other health problems, including diabetes and kidney problems.  Possibility of pregnancy, if this applies. RISKS AND COMPLICATIONS  Infection.  Inflammation of the eyeball (endophthalmitis) that can spread to both eyes (sympathetic ophthalmia).  Poor wound healing.  If an IOL is inserted, it can later fall out of proper position. This is very uncommon.  Clouding of the part of your eye that holds an IOL in place. This is called an "after-cataract." These are uncommon but easily treated. BEFORE THE PROCEDURE  Do not eat or drink anything except small amounts of water for 8 to 12 before your surgery, or as  directed by your caregiver.  Unless you are told otherwise, continue any eye drops you have been prescribed.  Talk to your primary caregiver about all other medicines that you take (both prescription and nonprescription). In some cases, you may need to stop or change medicines near the time of your surgery. This is most important if you are taking blood-thinning medicine.Do not stop medicines unless you are told to do so.  Arrange for someone to drive you to and  from the procedure.  Do not put contact lenses in either eye on the day of your surgery. PROCEDURE There is more than one method for safely removing a cataract. Your doctor can explain the differences and help determine which is best for you. Phacoemulsification surgery is the most common form of cataract surgery.  An injection is given behind the eye or eye drops are given to make this a painless procedure.  A small cut (incision) is made on the edge of the clear, dome-shaped surface that covers the front of the eye (cornea).  A tiny probe is painlessly inserted into the eye. This device gives off ultrasound waves that soften and break up the cloudy center of the lens. This makes it easier for the cloudy lens to be removed by suction.  An IOL may be implanted.  The normal lens of the eye is covered by a clear capsule. Part of that capsule is intentionally left in the eye to support the IOL.  Your surgeon may or may not use stitches to close the incision. There are other forms of cataract surgery that require a larger incision and stitches to close the eye. This approach is taken in cases where the doctor feels that the cataract cannot be easily removed using phacoemulsification. AFTER THE PROCEDURE  When an IOL is implanted, it does not need care. It becomes a permanent part of your eye and cannot be seen or felt.  Your doctor will schedule follow-up exams to check on your progress.  Review your other medicines with your doctor to see which can be resumed after surgery.  Use eye drops or take medicine as prescribed by your doctor.   This information is not intended to replace advice given to you by your health care provider. Make sure you discuss any questions you have with your health care provider.   Document Released: 05/23/2011 Document Revised: 06/24/2014 Document Reviewed: 05/23/2011 Elsevier Interactive Patient Education [employer].

## 2015-04-26 ENCOUNTER — Encounter (HOSPITAL_COMMUNITY): Payer: Self-pay

## 2015-04-26 ENCOUNTER — Other Ambulatory Visit: Payer: Self-pay

## 2015-04-26 ENCOUNTER — Encounter (HOSPITAL_COMMUNITY)
Admission: RE | Admit: 2015-04-26 | Discharge: 2015-04-26 | Disposition: A | Discharge status: Home / Self Care | Payer: Medicare Other | Source: Ambulatory Visit | Attending: Ophthalmology | Admitting: Ophthalmology

## 2015-04-26 DIAGNOSIS — Z01818 Encounter for other preprocedural examination: Secondary | ICD-10-CM | POA: Insufficient documentation

## 2015-04-26 DIAGNOSIS — H2512 Age-related nuclear cataract, left eye: Secondary | ICD-10-CM | POA: Insufficient documentation

## 2015-04-26 HISTORY — DX: Hypothyroidism, unspecified: E03.9

## 2015-04-26 LAB — CBC
HEMATOCRIT: 38.9 % (ref 36.0–46.0)
Hemoglobin: 12.7 g/dL (ref 12.0–15.0)
MCH: 31.2 pg (ref 26.0–34.0)
MCHC: 32.6 g/dL (ref 30.0–36.0)
MCV: 95.6 fL (ref 78.0–100.0)
PLATELETS: 186 10*3/uL (ref 150–400)
RBC: 4.07 MIL/uL (ref 3.87–5.11)
RDW: 12.5 % (ref 11.5–15.5)
WBC: 6.5 10*3/uL (ref 4.0–10.5)

## 2015-04-26 LAB — BASIC METABOLIC PANEL
Anion gap: 6 (ref 5–15)
BUN: 14 mg/dL (ref 6–20)
CALCIUM: 9.2 mg/dL (ref 8.9–10.3)
CO2: 30 mmol/L (ref 22–32)
CREATININE: 0.74 mg/dL (ref 0.44–1.00)
Chloride: 107 mmol/L (ref 101–111)
GFR calc non Af Amer: >60 mL/min (ref >60)
Glucose, Bld: 92 mg/dL (ref 65–99)
Potassium: 4.4 mmol/L (ref 3.5–5.1)
Sodium: 143 mmol/L (ref 135–145)

## 2015-04-28 MED ORDER — PHENYLEPHRINE HCL 2.5 % OP SOLN
OPHTHALMIC | Status: AC
  Filled 2015-04-28: qty 15

## 2015-04-28 MED ORDER — NEOMYCIN-POLYMYXIN-DEXAMETH 3.5-10000-0.1 OP SUSP
OPHTHALMIC | Status: AC
  Filled 2015-04-28: qty 5

## 2015-04-28 MED ORDER — TETRACAINE HCL 0.5 % OP SOLN
OPHTHALMIC | Status: AC
  Filled 2015-04-28: qty 2

## 2015-04-28 MED ORDER — CYCLOPENTOLATE-PHENYLEPHRINE OP SOLN OPTIME - NO CHARGE
OPHTHALMIC | Status: AC
  Filled 2015-04-28: qty 2

## 2015-04-28 MED ORDER — LIDOCAINE HCL 3.5 % OP GEL
OPHTHALMIC | Status: AC
  Filled 2015-04-28: qty 1

## 2015-04-28 MED ORDER — LIDOCAINE HCL (PF) 1 % IJ SOLN
INTRAMUSCULAR | Status: AC
  Filled 2015-04-28: qty 2

## 2015-05-01 ENCOUNTER — Ambulatory Visit (HOSPITAL_COMMUNITY): Payer: Medicare Other | Admitting: Anesthesiology

## 2015-05-01 ENCOUNTER — Ambulatory Visit (HOSPITAL_COMMUNITY)
Admission: RE | Admit: 2015-05-01 | Discharge: 2015-05-01 | Disposition: A | Discharge status: Home / Self Care | Payer: Medicare Other | Source: Ambulatory Visit | Attending: Ophthalmology | Admitting: Ophthalmology

## 2015-05-01 ENCOUNTER — Encounter (HOSPITAL_COMMUNITY): Payer: Self-pay | Admitting: Ophthalmology

## 2015-05-01 ENCOUNTER — Encounter (HOSPITAL_COMMUNITY)
Admission: RE | Disposition: A | Discharge status: Home / Self Care | Payer: Self-pay | Source: Ambulatory Visit | Attending: Ophthalmology

## 2015-05-01 DIAGNOSIS — E039 Hypothyroidism, unspecified: Secondary | ICD-10-CM | POA: Diagnosis not present

## 2015-05-01 DIAGNOSIS — M1991 Primary osteoarthritis, unspecified site: Secondary | ICD-10-CM | POA: Insufficient documentation

## 2015-05-01 DIAGNOSIS — H25812 Combined forms of age-related cataract, left eye: Secondary | ICD-10-CM | POA: Diagnosis not present

## 2015-05-01 DIAGNOSIS — Z79899 Other long term (current) drug therapy: Secondary | ICD-10-CM | POA: Diagnosis not present

## 2015-05-01 DIAGNOSIS — Z87891 Personal history of nicotine dependence: Secondary | ICD-10-CM | POA: Insufficient documentation

## 2015-05-01 HISTORY — PX: CATARACT EXTRACTION W/PHACO: SHX586

## 2015-05-01 SURGERY — PHACOEMULSIFICATION, CATARACT, WITH IOL INSERTION
Anesthesia: Monitor Anesthesia Care | Site: Eye | Laterality: Left

## 2015-05-01 MED ORDER — FENTANYL CITRATE (PF) 100 MCG/2ML IJ SOLN
INTRAMUSCULAR | Status: AC
  Filled 2015-05-01: qty 2

## 2015-05-01 MED ORDER — MIDAZOLAM HCL 2 MG/2ML IJ SOLN
INTRAMUSCULAR | Status: AC
  Filled 2015-05-01: qty 2

## 2015-05-01 MED ORDER — TETRACAINE HCL 0.5 % OP SOLN
1.0000 [drp] | OPHTHALMIC | Status: AC
  Administered 2015-05-01 (×3): 1 [drp] via OPHTHALMIC

## 2015-05-01 MED ORDER — NEOMYCIN-POLYMYXIN-DEXAMETH 3.5-10000-0.1 OP SUSP
OPHTHALMIC | Status: DC | PRN
  Administered 2015-05-01: 2 [drp] via OPHTHALMIC

## 2015-05-01 MED ORDER — BSS IO SOLN
INTRAOCULAR | Status: DC | PRN
  Administered 2015-05-01: 15 mL via INTRAOCULAR

## 2015-05-01 MED ORDER — CYCLOPENTOLATE-PHENYLEPHRINE 0.2-1 % OP SOLN
1.0000 [drp] | OPHTHALMIC | Status: AC
  Administered 2015-05-01 (×3): 1 [drp] via OPHTHALMIC

## 2015-05-01 MED ORDER — LACTATED RINGERS IV SOLN
INTRAVENOUS | Status: DC
  Administered 2015-05-01: 1000 mL via INTRAVENOUS

## 2015-05-01 MED ORDER — EPINEPHRINE HCL 1 MG/ML IJ SOLN
INTRAOCULAR | Status: DC | PRN
  Administered 2015-05-01: 08:00:00

## 2015-05-01 MED ORDER — MIDAZOLAM HCL 2 MG/2ML IJ SOLN
1.0000 mg | INTRAMUSCULAR | Status: DC | PRN
  Administered 2015-05-01: 2 mg via INTRAVENOUS

## 2015-05-01 MED ORDER — LIDOCAINE HCL (PF) 1 % IJ SOLN
INTRAMUSCULAR | Status: DC | PRN
  Administered 2015-05-01: .6 mL

## 2015-05-01 MED ORDER — PROVISC 10 MG/ML IO SOLN
INTRAOCULAR | Status: DC | PRN
  Administered 2015-05-01: 0.85 mL via INTRAOCULAR

## 2015-05-01 MED ORDER — PHENYLEPHRINE HCL 2.5 % OP SOLN
1.0000 [drp] | OPHTHALMIC | Status: AC
Start: 2015-05-01 — End: 2015-05-01
  Administered 2015-05-01 (×3): 1 [drp] via OPHTHALMIC

## 2015-05-01 MED ORDER — POVIDONE-IODINE 5 % OP SOLN
OPHTHALMIC | Status: DC | PRN
  Administered 2015-05-01: 1 via OPHTHALMIC

## 2015-05-01 MED ORDER — EPINEPHRINE HCL 1 MG/ML IJ SOLN
INTRAMUSCULAR | Status: AC
  Filled 2015-05-01: qty 1

## 2015-05-01 MED ORDER — LIDOCAINE HCL 3.5 % OP GEL
1.0000 "application " | Freq: Once | OPHTHALMIC | Status: AC
  Administered 2015-05-01: 1 via OPHTHALMIC

## 2015-05-01 SURGICAL SUPPLY — 34 items
CAPSULAR TENSION RING-AMO (OPHTHALMIC RELATED) IMPLANT
CLOTH BEACON ORANGE TIMEOUT ST (SAFETY) ×2 IMPLANT
EYE SHIELD UNIVERSAL CLEAR (GAUZE/BANDAGES/DRESSINGS) ×2 IMPLANT
GLOVE BIO SURGEON STRL SZ 6.5 (GLOVE) IMPLANT
GLOVE BIO SURGEONS STRL SZ 6.5 (GLOVE)
GLOVE BIOGEL PI IND STRL 6.5 (GLOVE) IMPLANT
GLOVE BIOGEL PI IND STRL 7.0 (GLOVE) IMPLANT
GLOVE BIOGEL PI IND STRL 7.5 (GLOVE) IMPLANT
GLOVE BIOGEL PI INDICATOR 6.5 (GLOVE)
GLOVE BIOGEL PI INDICATOR 7.0 (GLOVE) ×4
GLOVE BIOGEL PI INDICATOR 7.5 (GLOVE)
GLOVE ECLIPSE 6.5 STRL STRAW (GLOVE) IMPLANT
GLOVE ECLIPSE 7.0 STRL STRAW (GLOVE) IMPLANT
GLOVE ECLIPSE 7.5 STRL STRAW (GLOVE) IMPLANT
GLOVE EXAM NITRILE LRG STRL (GLOVE) IMPLANT
GLOVE EXAM NITRILE MD LF STRL (GLOVE) IMPLANT
GLOVE SKINSENSE NS SZ6.5 (GLOVE)
GLOVE SKINSENSE NS SZ7.0 (GLOVE)
GLOVE SKINSENSE STRL SZ6.5 (GLOVE) IMPLANT
GLOVE SKINSENSE STRL SZ7.0 (GLOVE) IMPLANT
KIT VITRECTOMY (OPHTHALMIC RELATED) IMPLANT
PAD ARMBOARD 7.5X6 YLW CONV (MISCELLANEOUS) ×2 IMPLANT
PROC W NO LENS (INTRAOCULAR LENS)
PROC W SPEC LENS (INTRAOCULAR LENS)
PROCESS W NO LENS (INTRAOCULAR LENS) IMPLANT
PROCESS W SPEC LENS (INTRAOCULAR LENS) IMPLANT
RETRACTOR IRIS SIGHTPATH (OPHTHALMIC RELATED) IMPLANT
RING MALYGIN (MISCELLANEOUS) IMPLANT
SIGHTPATH CAT PROC W REG LENS (Ophthalmic Related) ×3 IMPLANT
SYRINGE LUER LOK 1CC (MISCELLANEOUS) ×2 IMPLANT
TAPE SURG TRANSPARENT 2IN (GAUZE/BANDAGES/DRESSINGS) IMPLANT
TAPE TRANSPARENT 2IN (GAUZE/BANDAGES/DRESSINGS) ×2
VISCOELASTIC ADDITIONAL (OPHTHALMIC RELATED) IMPLANT
WATER STERILE IRR 250ML POUR (IV SOLUTION) ×2 IMPLANT

## 2015-05-01 NOTE — Transfer of Care (Signed)
Immediate Anesthesia Transfer of Care Note  Patient: Michelle Bradford  Procedure(s) Performed: Procedure(s) with comments: CATARACT EXTRACTION PHACO AND INTRAOCULAR LENS PLACEMENT LEFT EYE (Left) - CDE:7.51  Patient Location: Short Stay  Anesthesia Type:MAC  Level of Consciousness: awake, alert , oriented and patient cooperative  Airway & Oxygen Therapy: Patient Spontanous Breathing  Post-op Assessment: Report given to RN  Post vital signs: Reviewed and stable  Last Vitals:  Filed Vitals:   05/01/15 0645  BP: 154/67  Pulse:   Temp:   Resp: 20    Complications: No apparent anesthesia complications

## 2015-05-01 NOTE — H&P (Signed)
I have reviewed the H&P, the patient was re-examined, and I have identified no interval changes in medical condition and plan of care since the history and physical of record  

## 2015-05-01 NOTE — Op Note (Signed)
Date of Admission: 05/01/2015  Date of Surgery: 05/01/2015   Pre-Op Dx: Cataract Left Eye  Post-Op Dx: Senile Combined Cataract Left  Eye,  Dx Code [id number]  Surgeon: Tonny Branch, M.D.  Assistants: None  Anesthesia: Topical with MAC  Indications: Painless, progressive loss of vision with compromise of daily activities.  Surgery: Cataract Extraction with Intraocular lens Implant Left Eye  Discription: The patient had dilating drops and viscous lidocaine placed into the Left eye in the pre-op holding area. After transfer to the operating room, a time out was performed. The patient was then prepped and draped. Beginning with a 50 degree blade a paracentesis port was made at the surgeon's 2 o'clock position. The anterior chamber was then filled with 1% non-preserved lidocaine. This was followed by filling the anterior chamber with Provisc.  A 2.50mm keratome blade was used to make a clear corneal incision at the temporal limbus.  A bent cystatome needle was used to create a continuous tear capsulotomy. Hydrodissection was performed with balanced salt solution on a Fine canula. The lens nucleus was then removed using the phacoemulsification handpiece. Residual cortex was removed with the I&A handpiece. The anterior chamber and capsular bag were refilled with Provisc. A posterior chamber intraocular lens was placed into the capsular bag with it's injector. The implant was positioned with the Kuglan hook. The Provisc was then removed from the anterior chamber and capsular bag with the I&A handpiece. Stromal hydration of the main incision and paracentesis port was performed with BSS on a Fine canula. The wounds were tested for leak which was negative. The patient tolerated the procedure well. There were no operative complications. The patient was then transferred to the recovery room in stable condition.  Complications: None  Specimen: None  EBL: None  Prosthetic device: Hoya iSert 250, power 22.5 D,  SN [id number].

## 2015-05-01 NOTE — Anesthesia Postprocedure Evaluation (Signed)
  Anesthesia Post-op Note  Patient: Michelle Bradford  Procedure(s) Performed: Procedure(s) with comments: CATARACT EXTRACTION PHACO AND INTRAOCULAR LENS PLACEMENT LEFT EYE (Left) - CDE:7.51  Patient Location: Short Stay  Anesthesia Type:MAC  Level of Consciousness: awake, alert , oriented and patient cooperative  Airway and Oxygen Therapy: Patient Spontanous Breathing  Post-op Pain: none  Post-op Assessment: Post-op Vital signs reviewed, Patient's Cardiovascular Status Stable, Respiratory Function Stable, Patent Airway, No signs of Nausea or vomiting, Adequate PO intake and Pain level controlled              Post-op Vital Signs: Reviewed and stable  Last Vitals:  Filed Vitals:   05/01/15 0645  BP: 154/67  Pulse:   Temp:   Resp: 20    Complications: No apparent anesthesia complications

## 2015-05-01 NOTE — Anesthesia Preprocedure Evaluation (Signed)
Anesthesia Evaluation  Patient identified by MRN, date of birth, ID band Patient awake    Reviewed: Allergy & Precautions, NPO status , Patient's Chart, lab work & pertinent test results  Airway Mallampati: II  TM Distance: >3 FB Neck ROM: Full    Dental  (+) Teeth Intact   Pulmonary former smoker,    Pulmonary exam normal        Cardiovascular Normal cardiovascular exam     Neuro/Psych    GI/Hepatic   Endo/Other  Hypothyroidism   Renal/GU      Musculoskeletal  (+) Arthritis , Osteoarthritis,    Abdominal Normal abdominal exam  (+)   Peds  Hematology   Anesthesia Other Findings   Reproductive/Obstetrics                             Anesthesia Physical Anesthesia Plan  ASA: II  Anesthesia Plan: MAC   Post-op Pain Management:    Induction: Intravenous  Airway Management Planned: Nasal Cannula  Additional Equipment:   Intra-op Plan:   Post-operative Plan:   Informed Consent: I have reviewed the patients History and Physical, chart, labs and discussed the procedure including the risks, benefits and alternatives for the proposed anesthesia with the patient or authorized representative who has indicated his/her understanding and acceptance.   Dental advisory given  Plan Discussed with: CRNA  Anesthesia Plan Comments:         Anesthesia Quick Evaluation

## 2015-05-02 ENCOUNTER — Encounter (HOSPITAL_COMMUNITY): Payer: Self-pay | Admitting: Ophthalmology

## 2015-05-09 ENCOUNTER — Inpatient Hospital Stay (HOSPITAL_COMMUNITY): Admission: RE | Admit: 2015-05-09 | Payer: Medicare Other | Source: Ambulatory Visit

## 2015-05-23 ENCOUNTER — Encounter (HOSPITAL_COMMUNITY)
Admission: RE | Admit: 2015-05-23 | Discharge: 2015-05-23 | Disposition: A | Discharge status: Home / Self Care | Payer: Medicare Other | Source: Ambulatory Visit | Attending: Ophthalmology | Admitting: Ophthalmology

## 2015-05-29 ENCOUNTER — Encounter (HOSPITAL_COMMUNITY)
Admission: RE | Disposition: A | Discharge status: Home / Self Care | Payer: Self-pay | Source: Ambulatory Visit | Attending: Ophthalmology

## 2015-05-29 ENCOUNTER — Ambulatory Visit (HOSPITAL_COMMUNITY): Payer: Medicare Other | Admitting: Anesthesiology

## 2015-05-29 ENCOUNTER — Ambulatory Visit (HOSPITAL_COMMUNITY)
Admission: RE | Admit: 2015-05-29 | Discharge: 2015-05-29 | Disposition: A | Discharge status: Home / Self Care | Payer: Medicare Other | Source: Ambulatory Visit | Attending: Ophthalmology | Admitting: Ophthalmology

## 2015-05-29 ENCOUNTER — Encounter (HOSPITAL_COMMUNITY): Payer: Self-pay | Admitting: Anesthesiology

## 2015-05-29 DIAGNOSIS — M1991 Primary osteoarthritis, unspecified site: Secondary | ICD-10-CM | POA: Insufficient documentation

## 2015-05-29 DIAGNOSIS — Z87891 Personal history of nicotine dependence: Secondary | ICD-10-CM | POA: Insufficient documentation

## 2015-05-29 DIAGNOSIS — H25811 Combined forms of age-related cataract, right eye: Secondary | ICD-10-CM | POA: Insufficient documentation

## 2015-05-29 DIAGNOSIS — Z79899 Other long term (current) drug therapy: Secondary | ICD-10-CM | POA: Insufficient documentation

## 2015-05-29 DIAGNOSIS — E039 Hypothyroidism, unspecified: Secondary | ICD-10-CM | POA: Insufficient documentation

## 2015-05-29 HISTORY — PX: CATARACT EXTRACTION W/PHACO: SHX586

## 2015-05-29 SURGERY — PHACOEMULSIFICATION, CATARACT, WITH IOL INSERTION
Anesthesia: Monitor Anesthesia Care | Site: Eye | Laterality: Right

## 2015-05-29 MED ORDER — POVIDONE-IODINE 5 % OP SOLN
OPHTHALMIC | Status: DC | PRN
  Administered 2015-05-29: 1 via OPHTHALMIC

## 2015-05-29 MED ORDER — NEOMYCIN-POLYMYXIN-DEXAMETH 3.5-10000-0.1 OP SUSP
OPHTHALMIC | Status: DC | PRN
  Administered 2015-05-29: 1 [drp] via OPHTHALMIC

## 2015-05-29 MED ORDER — CYCLOPENTOLATE-PHENYLEPHRINE 0.2-1 % OP SOLN
1.0000 [drp] | OPHTHALMIC | Status: AC
  Administered 2015-05-29 (×3): 1 [drp] via OPHTHALMIC

## 2015-05-29 MED ORDER — TETRACAINE HCL 0.5 % OP SOLN
1.0000 [drp] | OPHTHALMIC | Status: AC
  Administered 2015-05-29 (×3): 1 [drp] via OPHTHALMIC

## 2015-05-29 MED ORDER — BSS IO SOLN
INTRAOCULAR | Status: DC | PRN
  Administered 2015-05-29: 15 mL via INTRAOCULAR

## 2015-05-29 MED ORDER — LACTATED RINGERS IV SOLN
INTRAVENOUS | Status: DC
  Administered 2015-05-29: 11:00:00 via INTRAVENOUS

## 2015-05-29 MED ORDER — FENTANYL CITRATE (PF) 100 MCG/2ML IJ SOLN
25.0000 ug | Freq: Once | INTRAMUSCULAR | Status: AC
  Administered 2015-05-29: 25 ug via INTRAVENOUS
  Filled 2015-05-29: qty 2

## 2015-05-29 MED ORDER — LIDOCAINE HCL 3.5 % OP GEL
1.0000 "application " | Freq: Once | OPHTHALMIC | Status: AC
  Administered 2015-05-29: 1 via OPHTHALMIC

## 2015-05-29 MED ORDER — EPINEPHRINE HCL 1 MG/ML IJ SOLN
INTRAMUSCULAR | Status: AC
  Filled 2015-05-29: qty 1

## 2015-05-29 MED ORDER — MIDAZOLAM HCL 2 MG/2ML IJ SOLN
1.0000 mg | INTRAMUSCULAR | Status: DC | PRN
  Administered 2015-05-29: 2 mg via INTRAVENOUS
  Filled 2015-05-29: qty 2

## 2015-05-29 MED ORDER — PROVISC 10 MG/ML IO SOLN
INTRAOCULAR | Status: DC | PRN
  Administered 2015-05-29: 0.85 mL via INTRAOCULAR

## 2015-05-29 MED ORDER — EPINEPHRINE HCL 1 MG/ML IJ SOLN
INTRAMUSCULAR | Status: DC | PRN
  Administered 2015-05-29: 500 mL

## 2015-05-29 MED ORDER — PHENYLEPHRINE HCL 2.5 % OP SOLN
1.0000 [drp] | OPHTHALMIC | Status: AC
  Administered 2015-05-29 (×3): 1 [drp] via OPHTHALMIC

## 2015-05-29 MED ORDER — LIDOCAINE HCL (PF) 1 % IJ SOLN
INTRAMUSCULAR | Status: DC | PRN
  Administered 2015-05-29: .5 mL

## 2015-05-29 SURGICAL SUPPLY — 34 items
CAPSULAR TENSION RING-AMO (OPHTHALMIC RELATED) IMPLANT
CLOTH BEACON ORANGE TIMEOUT ST (SAFETY) ×2 IMPLANT
EYE SHIELD UNIVERSAL CLEAR (GAUZE/BANDAGES/DRESSINGS) ×2 IMPLANT
GLOVE BIO SURGEON STRL SZ 6.5 (GLOVE) IMPLANT
GLOVE BIO SURGEONS STRL SZ 6.5 (GLOVE)
GLOVE BIOGEL PI IND STRL 6.5 (GLOVE) IMPLANT
GLOVE BIOGEL PI IND STRL 7.0 (GLOVE) IMPLANT
GLOVE BIOGEL PI IND STRL 7.5 (GLOVE) IMPLANT
GLOVE BIOGEL PI INDICATOR 6.5 (GLOVE)
GLOVE BIOGEL PI INDICATOR 7.0 (GLOVE)
GLOVE BIOGEL PI INDICATOR 7.5 (GLOVE) ×4
GLOVE ECLIPSE 6.5 STRL STRAW (GLOVE) IMPLANT
GLOVE ECLIPSE 7.0 STRL STRAW (GLOVE) IMPLANT
GLOVE ECLIPSE 7.5 STRL STRAW (GLOVE) IMPLANT
GLOVE EXAM NITRILE LRG STRL (GLOVE) IMPLANT
GLOVE EXAM NITRILE MD LF STRL (GLOVE) IMPLANT
GLOVE SKINSENSE NS SZ6.5 (GLOVE)
GLOVE SKINSENSE NS SZ7.0 (GLOVE)
GLOVE SKINSENSE STRL SZ6.5 (GLOVE) IMPLANT
GLOVE SKINSENSE STRL SZ7.0 (GLOVE) IMPLANT
KIT VITRECTOMY (OPHTHALMIC RELATED) IMPLANT
PAD ARMBOARD 7.5X6 YLW CONV (MISCELLANEOUS) ×2 IMPLANT
PROC W NO LENS (INTRAOCULAR LENS)
PROC W SPEC LENS (INTRAOCULAR LENS)
PROCESS W NO LENS (INTRAOCULAR LENS) IMPLANT
PROCESS W SPEC LENS (INTRAOCULAR LENS) IMPLANT
RETRACTOR IRIS SIGHTPATH (OPHTHALMIC RELATED) IMPLANT
RING MALYGIN (MISCELLANEOUS) IMPLANT
SIGHTPATH CAT PROC W REG LENS (Ophthalmic Related) ×3 IMPLANT
SYRINGE LUER LOK 1CC (MISCELLANEOUS) ×2 IMPLANT
TAPE SURG TRANSPORE 1 IN (GAUZE/BANDAGES/DRESSINGS) IMPLANT
TAPE SURGICAL TRANSPORE 1 IN (GAUZE/BANDAGES/DRESSINGS) ×2
VISCOELASTIC ADDITIONAL (OPHTHALMIC RELATED) IMPLANT
WATER STERILE IRR 250ML POUR (IV SOLUTION) ×2 IMPLANT

## 2015-05-29 NOTE — H&P (Signed)
I have reviewed the H&P, the patient was re-examined, and I have identified no interval changes in medical condition and plan of care since the history and physical of record  

## 2015-05-29 NOTE — Op Note (Signed)
Date of Admission: 05/29/2015  Date of Surgery: 05/29/2015   Pre-Op Dx: Cataract Right Eye  Post-Op Dx: Senile Combined Cataract Right  Eye,  Dx Code [id number]  Surgeon: Tonny Branch, M.D.  Assistants: None  Anesthesia: Topical with MAC  Indications: Painless, progressive loss of vision with compromise of daily activities.  Surgery: Cataract Extraction with Intraocular lens Implant Right Eye  Discription: The patient had dilating drops and viscous lidocaine placed into the Right eye in the pre-op holding area. After transfer to the operating room, a time out was performed. The patient was then prepped and draped. Beginning with a 35 degree blade a paracentesis port was made at the surgeon's 2 o'clock position. The anterior chamber was then filled with 1% non-preserved lidocaine. This was followed by filling the anterior chamber with Provisc.  A 2.53mm keratome blade was used to make a clear corneal incision at the temporal limbus.  A bent cystatome needle was used to create a continuous tear capsulotomy. Hydrodissection was performed with balanced salt solution on a Fine canula. The lens nucleus was then removed using the phacoemulsification handpiece. Residual cortex was removed with the I&A handpiece. The anterior chamber and capsular bag were refilled with Provisc. A posterior chamber intraocular lens was placed into the capsular bag with it's injector. The implant was positioned with the Kuglan hook. The Provisc was then removed from the anterior chamber and capsular bag with the I&A handpiece. Stromal hydration of the main incision and paracentesis port was performed with BSS on a Fine canula. The wounds were tested for leak which was negative. The patient tolerated the procedure well. There were no operative complications. The patient was then transferred to the recovery room in stable condition.  Complications: None  Specimen: None  EBL: None  Prosthetic device: Hoya iSert 250, power 21.5  D, SN [id number].

## 2015-05-29 NOTE — Transfer of Care (Signed)
Immediate Anesthesia Transfer of Care Note  Patient: Michelle Bradford  Procedure(s) Performed: Procedure(s): CATARACT EXTRACTION PHACO AND INTRAOCULAR LENS PLACEMENT RIGHT EYE;  CDE:  9.91 (Right)  Patient Location: Short Stay  Anesthesia Type:MAC  Level of Consciousness: awake, alert , oriented and patient cooperative  Airway & Oxygen Therapy: Patient Spontanous Breathing  Post-op Assessment: Report given to RN and Post -op Vital signs reviewed and stable  Post vital signs: Reviewed and stable  Last Vitals:  Filed Vitals:   05/29/15 1025  BP: 169/75  Pulse: 71  Temp: 36.4 C  Resp: 16    Complications: No apparent anesthesia complications

## 2015-05-29 NOTE — Anesthesia Preprocedure Evaluation (Signed)
Anesthesia Evaluation  Patient identified by MRN, date of birth, ID band Patient awake    Reviewed: Allergy & Precautions, NPO status , Patient's Chart, lab work & pertinent test results  Airway Mallampati: II  TM Distance: >3 FB Neck ROM: Full    Dental  (+) Teeth Intact   Pulmonary former smoker,    Pulmonary exam normal        Cardiovascular Normal cardiovascular exam     Neuro/Psych    GI/Hepatic   Endo/Other  Hypothyroidism   Renal/GU      Musculoskeletal  (+) Arthritis , Osteoarthritis,    Abdominal Normal abdominal exam  (+)   Peds  Hematology   Anesthesia Other Findings   Reproductive/Obstetrics                             Anesthesia Physical Anesthesia Plan  ASA: II  Anesthesia Plan: MAC   Post-op Pain Management:    Induction: Intravenous  Airway Management Planned: Nasal Cannula  Additional Equipment:   Intra-op Plan:   Post-operative Plan:   Informed Consent: I have reviewed the patients History and Physical, chart, labs and discussed the procedure including the risks, benefits and alternatives for the proposed anesthesia with the patient or authorized representative who has indicated his/her understanding and acceptance.   Dental advisory given  Plan Discussed with: CRNA  Anesthesia Plan Comments:         Anesthesia Quick Evaluation

## 2015-05-29 NOTE — Anesthesia Procedure Notes (Signed)
Procedure Name: MAC Date/Time: 05/29/2015 11:02 AM Performed by: Andree Elk, Dakiyah Heinke A Pre-anesthesia Checklist: Patient identified, Timeout performed, Emergency Drugs available, Suction available and Patient being monitored Oxygen Delivery Method: Nasal cannula

## 2015-05-29 NOTE — Discharge Instructions (Signed)

## 2015-05-29 NOTE — Anesthesia Postprocedure Evaluation (Signed)
Anesthesia Post Note  Patient: Michelle Bradford  Procedure(s) Performed: Procedure(s) (LRB): CATARACT EXTRACTION PHACO AND INTRAOCULAR LENS PLACEMENT RIGHT EYE;  CDE:  9.91 (Right)  Patient location during evaluation: Short Stay Anesthesia Type: MAC Level of consciousness: awake, awake and alert and oriented Pain management: pain level controlled Vital Signs Assessment: post-procedure vital signs reviewed and stable Respiratory status: respiratory function stable and nonlabored ventilation Cardiovascular status: stable Postop Assessment: no signs of nausea or vomiting Anesthetic complications: no    Last Vitals:  Filed Vitals:   05/29/15 1025  BP: 169/75  Pulse: 71  Temp: 36.4 C  Resp: 16    Last Pain: There were no vitals filed for this visit.               Shaman Muscarella A

## 2015-05-30 ENCOUNTER — Encounter (HOSPITAL_COMMUNITY): Payer: Self-pay | Admitting: Ophthalmology

## 2015-09-08 DIAGNOSIS — E7800 Pure hypercholesterolemia, unspecified: Secondary | ICD-10-CM | POA: Diagnosis not present

## 2015-09-08 DIAGNOSIS — M7751 Other enthesopathy of right foot: Secondary | ICD-10-CM | POA: Diagnosis not present

## 2015-09-08 DIAGNOSIS — E663 Overweight: Secondary | ICD-10-CM | POA: Diagnosis not present

## 2015-09-08 DIAGNOSIS — Z1211 Encounter for screening for malignant neoplasm of colon: Secondary | ICD-10-CM | POA: Diagnosis not present

## 2015-09-08 DIAGNOSIS — R030 Elevated blood-pressure reading, without diagnosis of hypertension: Secondary | ICD-10-CM | POA: Diagnosis not present

## 2015-12-13 ENCOUNTER — Other Ambulatory Visit (HOSPITAL_COMMUNITY): Payer: Self-pay | Admitting: Internal Medicine

## 2015-12-13 DIAGNOSIS — Z1231 Encounter for screening mammogram for malignant neoplasm of breast: Secondary | ICD-10-CM

## 2015-12-15 DIAGNOSIS — E7800 Pure hypercholesterolemia, unspecified: Secondary | ICD-10-CM | POA: Diagnosis not present

## 2015-12-15 DIAGNOSIS — Z1211 Encounter for screening for malignant neoplasm of colon: Secondary | ICD-10-CM | POA: Diagnosis not present

## 2015-12-15 DIAGNOSIS — R030 Elevated blood-pressure reading, without diagnosis of hypertension: Secondary | ICD-10-CM | POA: Diagnosis not present

## 2015-12-15 DIAGNOSIS — E663 Overweight: Secondary | ICD-10-CM | POA: Diagnosis not present

## 2015-12-15 DIAGNOSIS — M7751 Other enthesopathy of right foot: Secondary | ICD-10-CM | POA: Diagnosis not present

## 2015-12-22 ENCOUNTER — Ambulatory Visit (HOSPITAL_COMMUNITY): Payer: Medicare Other

## 2016-01-01 ENCOUNTER — Ambulatory Visit (HOSPITAL_COMMUNITY)
Admission: RE | Admit: 2016-01-01 | Discharge: 2016-01-01 | Disposition: A | Discharge status: Home / Self Care | Payer: Medicare Other | Source: Ambulatory Visit | Attending: Internal Medicine | Admitting: Internal Medicine

## 2016-01-01 DIAGNOSIS — Z1231 Encounter for screening mammogram for malignant neoplasm of breast: Secondary | ICD-10-CM | POA: Insufficient documentation

## 2016-01-01 DIAGNOSIS — E039 Hypothyroidism, unspecified: Secondary | ICD-10-CM | POA: Diagnosis not present

## 2016-03-15 DIAGNOSIS — M25562 Pain in left knee: Secondary | ICD-10-CM | POA: Diagnosis not present

## 2016-03-15 DIAGNOSIS — M79605 Pain in left leg: Secondary | ICD-10-CM | POA: Diagnosis not present

## 2016-03-19 ENCOUNTER — Other Ambulatory Visit (HOSPITAL_COMMUNITY): Payer: Self-pay | Admitting: Internal Medicine

## 2016-03-19 DIAGNOSIS — H18413 Arcus senilis, bilateral: Secondary | ICD-10-CM | POA: Diagnosis not present

## 2016-03-19 DIAGNOSIS — H5203 Hypermetropia, bilateral: Secondary | ICD-10-CM | POA: Diagnosis not present

## 2016-03-19 DIAGNOSIS — H43812 Vitreous degeneration, left eye: Secondary | ICD-10-CM | POA: Diagnosis not present

## 2016-03-19 DIAGNOSIS — H35033 Hypertensive retinopathy, bilateral: Secondary | ICD-10-CM | POA: Diagnosis not present

## 2016-03-20 ENCOUNTER — Other Ambulatory Visit (HOSPITAL_COMMUNITY): Payer: Self-pay | Admitting: Internal Medicine

## 2016-03-20 ENCOUNTER — Ambulatory Visit (HOSPITAL_COMMUNITY)
Admission: RE | Admit: 2016-03-20 | Discharge: 2016-03-20 | Disposition: A | Discharge status: Home / Self Care | Payer: Medicare Other | Source: Ambulatory Visit | Attending: Internal Medicine | Admitting: Internal Medicine

## 2016-03-20 DIAGNOSIS — M79605 Pain in left leg: Secondary | ICD-10-CM

## 2016-03-20 DIAGNOSIS — M7989 Other specified soft tissue disorders: Secondary | ICD-10-CM | POA: Diagnosis not present

## 2016-03-20 DIAGNOSIS — M7122 Synovial cyst of popliteal space [Baker], left knee: Secondary | ICD-10-CM | POA: Diagnosis not present

## 2016-03-22 DIAGNOSIS — E039 Hypothyroidism, unspecified: Secondary | ICD-10-CM | POA: Diagnosis not present

## 2016-03-22 DIAGNOSIS — R739 Hyperglycemia, unspecified: Secondary | ICD-10-CM | POA: Diagnosis not present

## 2016-03-22 DIAGNOSIS — E785 Hyperlipidemia, unspecified: Secondary | ICD-10-CM | POA: Diagnosis not present

## 2016-03-22 DIAGNOSIS — E559 Vitamin D deficiency, unspecified: Secondary | ICD-10-CM | POA: Diagnosis not present

## 2016-03-22 DIAGNOSIS — M79605 Pain in left leg: Secondary | ICD-10-CM | POA: Diagnosis not present

## 2016-03-25 DIAGNOSIS — H02102 Unspecified ectropion of right lower eyelid: Secondary | ICD-10-CM | POA: Diagnosis not present

## 2016-03-25 DIAGNOSIS — H02105 Unspecified ectropion of left lower eyelid: Secondary | ICD-10-CM | POA: Diagnosis not present

## 2016-03-26 ENCOUNTER — Other Ambulatory Visit (HOSPITAL_COMMUNITY): Payer: Self-pay | Admitting: Internal Medicine

## 2016-03-26 DIAGNOSIS — M79605 Pain in left leg: Secondary | ICD-10-CM

## 2016-03-27 ENCOUNTER — Other Ambulatory Visit (HOSPITAL_COMMUNITY): Payer: Self-pay | Admitting: Internal Medicine

## 2016-03-27 DIAGNOSIS — M79605 Pain in left leg: Secondary | ICD-10-CM

## 2016-03-27 DIAGNOSIS — R6889 Other general symptoms and signs: Secondary | ICD-10-CM

## 2016-03-28 ENCOUNTER — Ambulatory Visit (HOSPITAL_COMMUNITY): Admission: RE | Admit: 2016-03-28 | Payer: Medicare Other | Source: Ambulatory Visit

## 2016-03-29 ENCOUNTER — Ambulatory Visit (HOSPITAL_COMMUNITY)
Admission: RE | Admit: 2016-03-29 | Discharge: 2016-03-29 | Disposition: A | Discharge status: Home / Self Care | Payer: Medicare Other | Source: Ambulatory Visit | Attending: Internal Medicine | Admitting: Internal Medicine

## 2016-03-29 ENCOUNTER — Ambulatory Visit (INDEPENDENT_AMBULATORY_CARE_PROVIDER_SITE_OTHER): Payer: Medicare Other | Admitting: Orthopedic Surgery

## 2016-03-29 ENCOUNTER — Ambulatory Visit (INDEPENDENT_AMBULATORY_CARE_PROVIDER_SITE_OTHER): Payer: Medicare Other

## 2016-03-29 VITALS — BP 146/74 | HR 68 | Ht 62.0 in | Wt 160.0 lb

## 2016-03-29 DIAGNOSIS — M7122 Synovial cyst of popliteal space [Baker], left knee: Secondary | ICD-10-CM | POA: Diagnosis not present

## 2016-03-29 DIAGNOSIS — M79605 Pain in left leg: Secondary | ICD-10-CM | POA: Insufficient documentation

## 2016-03-29 DIAGNOSIS — M1712 Unilateral primary osteoarthritis, left knee: Secondary | ICD-10-CM | POA: Diagnosis not present

## 2016-03-29 DIAGNOSIS — M7989 Other specified soft tissue disorders: Secondary | ICD-10-CM | POA: Diagnosis not present

## 2016-03-29 NOTE — Progress Notes (Signed)
Patient ID: Michelle Bradford, female   DOB: 18-Feb-1936, 80 y.o.   MRN: [id number]  Chief Complaint  Patient presents with  . Knee Pain    Left knee pain, referred by Jani Gravel.    HPI Michelle Bradford is a 80 y.o. female.  Presents for evaluation of left leg pain at the request of Dr. Maudie Mercury from the [facility] clinic.  She complains of posterior knee pain without trauma. She also complains of left leg edema chronic putting swelling down to her ankle fullness in the popliteal fossa now for several weeks with constant pain in the back of the knee. She has occasional symptoms of giving way of the left leg.  Review of Systems Review of Systems  Constitutional: Negative for chills and fever.  Musculoskeletal: Positive for joint swelling. Negative for back pain.  Skin: Negative for color change.    Past Medical History:  Diagnosis Date  . Hyperlipidemia   . Hypothyroidism   . Osteoporosis     Past Surgical History:  Procedure Laterality Date  . ABDOMINAL HYSTERECTOMY    . CATARACT EXTRACTION W/PHACO Left 05/01/2015   Procedure: CATARACT EXTRACTION PHACO AND INTRAOCULAR LENS PLACEMENT LEFT EYE;  Surgeon: Tonny Branch, MD;  Location: [facility];  Service: Ophthalmology;  Laterality: Left;  CDE:7.51  . CATARACT EXTRACTION W/PHACO Right 05/29/2015   Procedure: CATARACT EXTRACTION PHACO AND INTRAOCULAR LENS PLACEMENT RIGHT EYE;  CDE:  9.91;  Surgeon: Tonny Branch, MD;  Location: [facility];  Service: Ophthalmology;  Laterality: Right;  . HEMORROIDECTOMY    . LAPAROTOMY     removal of fibroids  . OPEN REDUCTION INTERNAL FIXATION (ORIF) HAND Left    wrist; has plates and screws in it.  Marland Kitchen SHOULDER ARTHROSCOPY Right    had a frozen shoulder    Social History Social History  Substance Use Topics  . Smoking status: Former Smoker    Packs/day: 0.50    Years: 20.00    Types: Cigarettes    Quit date: 04/25/2012  . Smokeless tobacco: Not on file  . Alcohol use No    No Known Allergies  No outpatient  prescriptions have been marked as taking for the 03/29/16 encounter (Office Visit) with Carole Civil, MD.      Physical Exam Physical Exam BP (!) 146/74   Pulse 68   Ht 5\' 2"  (1.575 m)   Wt 160 lb (72.6 kg)   BMI 29.26 kg/m   Gen. appearance. The patient is well-developed and well-nourished, grooming and hygiene are normal. There are no gross congenital abnormalities  The patient is alert and oriented to person place and time  Mood and affect are normal  Ambulation Painful gait on the left  Examination reveals the following: On inspection we find tenderness over the medial compartment popliteal fossa and calf no tenderness in the thigh posteriorly  With the range of motion of  right knee normal  Stability tests were normal    Strength tests revealed grade 5 motor strength  Skin we find no rash ulceration or erythema  Sensation remains intact  Impression vascular system unilateral peripheral edema right side no edema mild varicosities in the left leg   Data Reviewed Ultrasound was normal  Knee x-ray show mild patellofemoral disease and mild medial compartment arthritis  Assessment    Arthritis left knee Baker's cyst left knee Peripheral edema    Plan    Intra-articular injection of cortisone for the arthritis and Baker's cyst  Procedure note left knee injection  verbal consent was obtained to inject left knee joint  Timeout was completed to confirm the site of injection  The medications used were 40 mg of Depo-Medrol and 1% lidocaine 3 cc  Anesthesia was provided by ethyl chloride and the skin was prepped with alcohol.  After cleaning the skin with alcohol a 20-gauge needle was used to inject the left knee joint. There were no complications. A sterile bandage was applied.          Arther Abbott 03/29/2016, 9:52 AM

## 2016-03-29 NOTE — Patient Instructions (Signed)

## 2016-05-16 DIAGNOSIS — E039 Hypothyroidism, unspecified: Secondary | ICD-10-CM | POA: Diagnosis not present

## 2016-05-16 DIAGNOSIS — R739 Hyperglycemia, unspecified: Secondary | ICD-10-CM | POA: Diagnosis not present

## 2016-05-16 DIAGNOSIS — E559 Vitamin D deficiency, unspecified: Secondary | ICD-10-CM | POA: Diagnosis not present

## 2016-05-16 DIAGNOSIS — E785 Hyperlipidemia, unspecified: Secondary | ICD-10-CM | POA: Diagnosis not present

## 2016-05-21 DIAGNOSIS — R739 Hyperglycemia, unspecified: Secondary | ICD-10-CM | POA: Diagnosis not present

## 2016-05-21 DIAGNOSIS — E559 Vitamin D deficiency, unspecified: Secondary | ICD-10-CM | POA: Diagnosis not present

## 2016-05-21 DIAGNOSIS — E039 Hypothyroidism, unspecified: Secondary | ICD-10-CM | POA: Diagnosis not present

## 2016-08-05 DIAGNOSIS — D225 Melanocytic nevi of trunk: Secondary | ICD-10-CM | POA: Diagnosis not present

## 2016-08-05 DIAGNOSIS — L570 Actinic keratosis: Secondary | ICD-10-CM | POA: Diagnosis not present

## 2016-08-05 DIAGNOSIS — X32XXXD Exposure to sunlight, subsequent encounter: Secondary | ICD-10-CM | POA: Diagnosis not present

## 2016-10-15 DIAGNOSIS — E782 Mixed hyperlipidemia: Secondary | ICD-10-CM | POA: Diagnosis not present

## 2016-10-15 DIAGNOSIS — E039 Hypothyroidism, unspecified: Secondary | ICD-10-CM | POA: Diagnosis not present

## 2016-10-15 DIAGNOSIS — K5900 Constipation, unspecified: Secondary | ICD-10-CM | POA: Diagnosis not present

## 2016-10-21 ENCOUNTER — Other Ambulatory Visit (HOSPITAL_COMMUNITY): Payer: Self-pay | Admitting: Internal Medicine

## 2016-10-21 DIAGNOSIS — Z780 Asymptomatic menopausal state: Secondary | ICD-10-CM

## 2016-11-06 DIAGNOSIS — E782 Mixed hyperlipidemia: Secondary | ICD-10-CM | POA: Diagnosis not present

## 2016-11-08 ENCOUNTER — Ambulatory Visit (HOSPITAL_COMMUNITY)
Admission: RE | Admit: 2016-11-08 | Discharge: 2016-11-08 | Disposition: A | Discharge status: Home / Self Care | Payer: Medicare Other | Source: Ambulatory Visit | Attending: Internal Medicine | Admitting: Internal Medicine

## 2016-11-08 ENCOUNTER — Other Ambulatory Visit (HOSPITAL_COMMUNITY): Payer: Self-pay | Admitting: Oncology

## 2016-11-08 DIAGNOSIS — Z780 Asymptomatic menopausal state: Secondary | ICD-10-CM

## 2016-11-08 DIAGNOSIS — M85852 Other specified disorders of bone density and structure, left thigh: Secondary | ICD-10-CM | POA: Diagnosis not present

## 2016-11-08 DIAGNOSIS — M85831 Other specified disorders of bone density and structure, right forearm: Secondary | ICD-10-CM | POA: Diagnosis not present

## 2016-11-13 DIAGNOSIS — Z7189 Other specified counseling: Secondary | ICD-10-CM | POA: Diagnosis not present

## 2016-11-13 DIAGNOSIS — I83813 Varicose veins of bilateral lower extremities with pain: Secondary | ICD-10-CM | POA: Diagnosis not present

## 2016-11-13 DIAGNOSIS — K5900 Constipation, unspecified: Secondary | ICD-10-CM | POA: Diagnosis not present

## 2016-11-13 DIAGNOSIS — E039 Hypothyroidism, unspecified: Secondary | ICD-10-CM | POA: Diagnosis not present

## 2016-11-13 DIAGNOSIS — E782 Mixed hyperlipidemia: Secondary | ICD-10-CM | POA: Diagnosis not present

## 2016-11-13 DIAGNOSIS — Z6828 Body mass index (BMI) 28.0-28.9, adult: Secondary | ICD-10-CM | POA: Diagnosis not present

## 2016-11-18 DIAGNOSIS — H02102 Unspecified ectropion of right lower eyelid: Secondary | ICD-10-CM | POA: Diagnosis not present

## 2016-12-16 DIAGNOSIS — E039 Hypothyroidism, unspecified: Secondary | ICD-10-CM | POA: Diagnosis not present

## 2016-12-20 DIAGNOSIS — H3581 Retinal edema: Secondary | ICD-10-CM | POA: Diagnosis not present

## 2016-12-20 DIAGNOSIS — H34811 Central retinal vein occlusion, right eye, with macular edema: Secondary | ICD-10-CM | POA: Diagnosis not present

## 2016-12-26 DIAGNOSIS — H43822 Vitreomacular adhesion, left eye: Secondary | ICD-10-CM | POA: Diagnosis not present

## 2016-12-26 DIAGNOSIS — H35422 Microcystoid degeneration of retina, left eye: Secondary | ICD-10-CM | POA: Diagnosis not present

## 2016-12-26 DIAGNOSIS — H35032 Hypertensive retinopathy, left eye: Secondary | ICD-10-CM | POA: Diagnosis not present

## 2016-12-26 DIAGNOSIS — H34811 Central retinal vein occlusion, right eye, with macular edema: Secondary | ICD-10-CM | POA: Diagnosis not present

## 2017-01-20 ENCOUNTER — Encounter (INDEPENDENT_AMBULATORY_CARE_PROVIDER_SITE_OTHER): Payer: Medicare Other | Admitting: Ophthalmology

## 2017-01-27 DIAGNOSIS — H34811 Central retinal vein occlusion, right eye, with macular edema: Secondary | ICD-10-CM | POA: Diagnosis not present

## 2017-01-27 DIAGNOSIS — H35422 Microcystoid degeneration of retina, left eye: Secondary | ICD-10-CM | POA: Diagnosis not present

## 2017-01-27 DIAGNOSIS — H43822 Vitreomacular adhesion, left eye: Secondary | ICD-10-CM | POA: Diagnosis not present

## 2017-03-12 ENCOUNTER — Other Ambulatory Visit (HOSPITAL_COMMUNITY): Payer: Self-pay | Admitting: Internal Medicine

## 2017-03-12 DIAGNOSIS — Z1231 Encounter for screening mammogram for malignant neoplasm of breast: Secondary | ICD-10-CM

## 2017-03-20 ENCOUNTER — Ambulatory Visit (HOSPITAL_COMMUNITY)
Admission: RE | Admit: 2017-03-20 | Discharge: 2017-03-20 | Disposition: A | Discharge status: Home / Self Care | Payer: Medicare Other | Source: Ambulatory Visit | Attending: Internal Medicine | Admitting: Internal Medicine

## 2017-03-20 DIAGNOSIS — Z1231 Encounter for screening mammogram for malignant neoplasm of breast: Secondary | ICD-10-CM | POA: Insufficient documentation

## 2017-03-24 DIAGNOSIS — H35422 Microcystoid degeneration of retina, left eye: Secondary | ICD-10-CM | POA: Diagnosis not present

## 2017-03-24 DIAGNOSIS — H43822 Vitreomacular adhesion, left eye: Secondary | ICD-10-CM | POA: Diagnosis not present

## 2017-03-24 DIAGNOSIS — H3582 Retinal ischemia: Secondary | ICD-10-CM | POA: Diagnosis not present

## 2017-03-24 DIAGNOSIS — H34811 Central retinal vein occlusion, right eye, with macular edema: Secondary | ICD-10-CM | POA: Diagnosis not present

## 2017-04-04 DIAGNOSIS — Z23 Encounter for immunization: Secondary | ICD-10-CM | POA: Diagnosis not present

## 2017-04-21 DIAGNOSIS — H43813 Vitreous degeneration, bilateral: Secondary | ICD-10-CM | POA: Diagnosis not present

## 2017-04-21 DIAGNOSIS — H34811 Central retinal vein occlusion, right eye, with macular edema: Secondary | ICD-10-CM | POA: Diagnosis not present

## 2017-04-21 DIAGNOSIS — H35422 Microcystoid degeneration of retina, left eye: Secondary | ICD-10-CM | POA: Diagnosis not present

## 2017-05-19 DIAGNOSIS — E782 Mixed hyperlipidemia: Secondary | ICD-10-CM | POA: Diagnosis not present

## 2017-05-19 DIAGNOSIS — E039 Hypothyroidism, unspecified: Secondary | ICD-10-CM | POA: Diagnosis not present

## 2017-05-21 DIAGNOSIS — Z6827 Body mass index (BMI) 27.0-27.9, adult: Secondary | ICD-10-CM | POA: Diagnosis not present

## 2017-05-21 DIAGNOSIS — E039 Hypothyroidism, unspecified: Secondary | ICD-10-CM | POA: Diagnosis not present

## 2017-05-21 DIAGNOSIS — J040 Acute laryngitis: Secondary | ICD-10-CM | POA: Diagnosis not present

## 2017-05-21 DIAGNOSIS — R070 Pain in throat: Secondary | ICD-10-CM | POA: Diagnosis not present

## 2017-05-21 DIAGNOSIS — E870 Hyperosmolality and hypernatremia: Secondary | ICD-10-CM | POA: Diagnosis not present

## 2017-06-23 DIAGNOSIS — H34811 Central retinal vein occlusion, right eye, with macular edema: Secondary | ICD-10-CM | POA: Diagnosis not present

## 2017-06-23 DIAGNOSIS — H43822 Vitreomacular adhesion, left eye: Secondary | ICD-10-CM | POA: Diagnosis not present

## 2017-06-23 DIAGNOSIS — H0012 Chalazion right lower eyelid: Secondary | ICD-10-CM | POA: Diagnosis not present

## 2017-06-23 DIAGNOSIS — H35032 Hypertensive retinopathy, left eye: Secondary | ICD-10-CM | POA: Diagnosis not present

## 2017-07-28 DIAGNOSIS — H35032 Hypertensive retinopathy, left eye: Secondary | ICD-10-CM | POA: Diagnosis not present

## 2017-07-28 DIAGNOSIS — H35362 Drusen (degenerative) of macula, left eye: Secondary | ICD-10-CM | POA: Diagnosis not present

## 2017-07-28 DIAGNOSIS — H3582 Retinal ischemia: Secondary | ICD-10-CM | POA: Diagnosis not present

## 2017-07-28 DIAGNOSIS — H34811 Central retinal vein occlusion, right eye, with macular edema: Secondary | ICD-10-CM | POA: Diagnosis not present

## 2017-08-25 DIAGNOSIS — H353132 Nonexudative age-related macular degeneration, bilateral, intermediate dry stage: Secondary | ICD-10-CM | POA: Diagnosis not present

## 2017-08-25 DIAGNOSIS — H34811 Central retinal vein occlusion, right eye, with macular edema: Secondary | ICD-10-CM | POA: Diagnosis not present

## 2017-08-25 DIAGNOSIS — H35032 Hypertensive retinopathy, left eye: Secondary | ICD-10-CM | POA: Diagnosis not present

## 2017-08-25 DIAGNOSIS — H43813 Vitreous degeneration, bilateral: Secondary | ICD-10-CM | POA: Diagnosis not present

## 2017-10-01 DIAGNOSIS — E782 Mixed hyperlipidemia: Secondary | ICD-10-CM | POA: Diagnosis not present

## 2017-10-01 DIAGNOSIS — K5900 Constipation, unspecified: Secondary | ICD-10-CM | POA: Diagnosis not present

## 2017-10-01 DIAGNOSIS — I83813 Varicose veins of bilateral lower extremities with pain: Secondary | ICD-10-CM | POA: Diagnosis not present

## 2017-10-01 DIAGNOSIS — E039 Hypothyroidism, unspecified: Secondary | ICD-10-CM | POA: Diagnosis not present

## 2017-10-01 DIAGNOSIS — J040 Acute laryngitis: Secondary | ICD-10-CM | POA: Diagnosis not present

## 2017-10-01 DIAGNOSIS — E870 Hyperosmolality and hypernatremia: Secondary | ICD-10-CM | POA: Diagnosis not present

## 2017-10-01 DIAGNOSIS — Z6827 Body mass index (BMI) 27.0-27.9, adult: Secondary | ICD-10-CM | POA: Diagnosis not present

## 2017-10-01 DIAGNOSIS — R070 Pain in throat: Secondary | ICD-10-CM | POA: Diagnosis not present

## 2017-10-10 DIAGNOSIS — E870 Hyperosmolality and hypernatremia: Secondary | ICD-10-CM | POA: Diagnosis not present

## 2017-10-10 DIAGNOSIS — E039 Hypothyroidism, unspecified: Secondary | ICD-10-CM | POA: Diagnosis not present

## 2017-10-10 DIAGNOSIS — K5900 Constipation, unspecified: Secondary | ICD-10-CM | POA: Diagnosis not present

## 2017-10-10 DIAGNOSIS — Z0000 Encounter for general adult medical examination without abnormal findings: Secondary | ICD-10-CM | POA: Diagnosis not present

## 2017-10-10 DIAGNOSIS — I83813 Varicose veins of bilateral lower extremities with pain: Secondary | ICD-10-CM | POA: Diagnosis not present

## 2017-10-10 DIAGNOSIS — J040 Acute laryngitis: Secondary | ICD-10-CM | POA: Diagnosis not present

## 2017-10-10 DIAGNOSIS — I10 Essential (primary) hypertension: Secondary | ICD-10-CM | POA: Diagnosis not present

## 2017-10-10 DIAGNOSIS — R070 Pain in throat: Secondary | ICD-10-CM | POA: Diagnosis not present

## 2017-10-10 DIAGNOSIS — E782 Mixed hyperlipidemia: Secondary | ICD-10-CM | POA: Diagnosis not present

## 2017-10-10 DIAGNOSIS — Z6827 Body mass index (BMI) 27.0-27.9, adult: Secondary | ICD-10-CM | POA: Diagnosis not present

## 2017-10-10 DIAGNOSIS — H3530 Unspecified macular degeneration: Secondary | ICD-10-CM | POA: Diagnosis not present

## 2017-10-10 DIAGNOSIS — H6500 Acute serous otitis media, unspecified ear: Secondary | ICD-10-CM | POA: Diagnosis not present

## 2017-10-10 DIAGNOSIS — Z6828 Body mass index (BMI) 28.0-28.9, adult: Secondary | ICD-10-CM | POA: Diagnosis not present

## 2017-10-24 DIAGNOSIS — H34811 Central retinal vein occlusion, right eye, with macular edema: Secondary | ICD-10-CM | POA: Diagnosis not present

## 2017-10-24 DIAGNOSIS — H3582 Retinal ischemia: Secondary | ICD-10-CM | POA: Diagnosis not present

## 2017-10-24 DIAGNOSIS — H35032 Hypertensive retinopathy, left eye: Secondary | ICD-10-CM | POA: Diagnosis not present

## 2017-10-24 DIAGNOSIS — H43822 Vitreomacular adhesion, left eye: Secondary | ICD-10-CM | POA: Diagnosis not present

## 2017-11-21 DIAGNOSIS — H34811 Central retinal vein occlusion, right eye, with macular edema: Secondary | ICD-10-CM | POA: Diagnosis not present

## 2017-11-21 DIAGNOSIS — H353132 Nonexudative age-related macular degeneration, bilateral, intermediate dry stage: Secondary | ICD-10-CM | POA: Diagnosis not present

## 2017-11-21 DIAGNOSIS — H35373 Puckering of macula, bilateral: Secondary | ICD-10-CM | POA: Diagnosis not present

## 2017-11-21 DIAGNOSIS — H43813 Vitreous degeneration, bilateral: Secondary | ICD-10-CM | POA: Diagnosis not present

## 2017-11-30 ENCOUNTER — Encounter (HOSPITAL_COMMUNITY): Payer: Self-pay

## 2017-11-30 ENCOUNTER — Emergency Department (HOSPITAL_COMMUNITY)
Admission: EM | Admit: 2017-11-30 | Discharge: 2017-11-30 | Disposition: A | Discharge status: Home / Self Care | Payer: Medicare Other | Attending: Emergency Medicine | Admitting: Emergency Medicine

## 2017-11-30 ENCOUNTER — Other Ambulatory Visit: Payer: Self-pay

## 2017-11-30 ENCOUNTER — Emergency Department (HOSPITAL_COMMUNITY): Payer: Medicare Other

## 2017-11-30 DIAGNOSIS — E039 Hypothyroidism, unspecified: Secondary | ICD-10-CM | POA: Diagnosis not present

## 2017-11-30 DIAGNOSIS — Y9389 Activity, other specified: Secondary | ICD-10-CM | POA: Insufficient documentation

## 2017-11-30 DIAGNOSIS — Y92018 Other place in single-family (private) house as the place of occurrence of the external cause: Secondary | ICD-10-CM | POA: Diagnosis not present

## 2017-11-30 DIAGNOSIS — S93402A Sprain of unspecified ligament of left ankle, initial encounter: Secondary | ICD-10-CM | POA: Diagnosis not present

## 2017-11-30 DIAGNOSIS — Z79899 Other long term (current) drug therapy: Secondary | ICD-10-CM | POA: Insufficient documentation

## 2017-11-30 DIAGNOSIS — S51012A Laceration without foreign body of left elbow, initial encounter: Secondary | ICD-10-CM | POA: Diagnosis not present

## 2017-11-30 DIAGNOSIS — Z87891 Personal history of nicotine dependence: Secondary | ICD-10-CM | POA: Insufficient documentation

## 2017-11-30 DIAGNOSIS — W010XXA Fall on same level from slipping, tripping and stumbling without subsequent striking against object, initial encounter: Secondary | ICD-10-CM

## 2017-11-30 DIAGNOSIS — S59902A Unspecified injury of left elbow, initial encounter: Secondary | ICD-10-CM | POA: Diagnosis present

## 2017-11-30 DIAGNOSIS — S50312A Abrasion of left elbow, initial encounter: Secondary | ICD-10-CM | POA: Diagnosis not present

## 2017-11-30 DIAGNOSIS — Y999 Unspecified external cause status: Secondary | ICD-10-CM | POA: Diagnosis not present

## 2017-11-30 DIAGNOSIS — W01198A Fall on same level from slipping, tripping and stumbling with subsequent striking against other object, initial encounter: Secondary | ICD-10-CM | POA: Insufficient documentation

## 2017-11-30 MED ORDER — DOUBLE ANTIBIOTIC 500-10000 UNIT/GM EX OINT
TOPICAL_OINTMENT | Freq: Once | CUTANEOUS | Status: AC
Start: 2017-11-30 — End: 2017-11-30
  Administered 2017-11-30: 06:00:00 via TOPICAL
  Filled 2017-11-30: qty 1

## 2017-11-30 NOTE — ED Triage Notes (Signed)
Pt fell when walking. Said she accidentally rolled her ankle resulting in a fall. Skin tear on left upper arm as a result. Did not hit head.

## 2017-11-30 NOTE — ED Provider Notes (Signed)
[facility] EMERGENCY DEPARTMENT Provider Note   CSN: [id number] Arrival date & time: 11/30/17  0358     History   Chief Complaint Chief Complaint  Patient presents with  . Fall  . Laceration    HPI Michelle Bradford is a 82 y.o. female.  The history is provided by the patient.  She has history of hypothyroidism and hyperlipidemia and comes in having fallen at home.  She states that she twisted her left ankle and fell injuring her left upper arm.  She does not know when her last tetanus immunization was.  She denies head or neck injury or back injury.  Past Medical History:  Diagnosis Date  . Hyperlipidemia   . Hypothyroidism   . Osteoporosis     Patient Active Problem List   Diagnosis Date Noted  . Enterocele 11/11/2012  . SHOULDER PAIN 09/06/2009  . ELBOW PAIN, LEFT 09/06/2009  . IMPINGEMENT SYNDROME 09/06/2009  . LATERAL EPICONDYLITIS 09/06/2009    Past Surgical History:  Procedure Laterality Date  . ABDOMINAL HYSTERECTOMY    . CATARACT EXTRACTION W/PHACO Left 05/01/2015   Procedure: CATARACT EXTRACTION PHACO AND INTRAOCULAR LENS PLACEMENT LEFT EYE;  Surgeon: Tonny Branch, MD;  Location: [facility];  Service: Ophthalmology;  Laterality: Left;  CDE:7.51  . CATARACT EXTRACTION W/PHACO Right 05/29/2015   Procedure: CATARACT EXTRACTION PHACO AND INTRAOCULAR LENS PLACEMENT RIGHT EYE;  CDE:  9.91;  Surgeon: Tonny Branch, MD;  Location: [facility];  Service: Ophthalmology;  Laterality: Right;  . HEMORROIDECTOMY    . LAPAROTOMY     removal of fibroids  . OPEN REDUCTION INTERNAL FIXATION (ORIF) HAND Left    wrist; has plates and screws in it.  Marland Kitchen SHOULDER ARTHROSCOPY Right    had a frozen shoulder     OB History   None      Home Medications    Prior to Admission medications   Medication Sig Start Date End Date Taking? Authorizing Provider  acetaminophen (TYLENOL) 500 MG tablet Take 500 mg by mouth every 6 (six) hours as needed for mild pain.   Yes [provider]    levothyroxine (SYNTHROID, LEVOTHROID) 25 MCG tablet Take 25 mcg by mouth daily before breakfast.   Yes [provider]  simvastatin (ZOCOR) 40 MG tablet Take 40 mg by mouth daily.   Yes [provider]  traMADol (ULTRAM) 50 MG tablet Take 50 mg by mouth daily as needed for moderate pain.    [provider]    Family History History reviewed. No pertinent family history.  Social History Social History   Tobacco Use  . Smoking status: Former Smoker    Packs/day: 0.50    Years: 20.00    Pack years: 10.00    Types: Cigarettes    Last attempt to quit: 04/25/2012    Years since quitting: 5.6  . Smokeless tobacco: Never Used  Substance Use Topics  . Alcohol use: No  . Drug use: No     Allergies   Patient has no known allergies.   Review of Systems Review of Systems  All other systems reviewed and are negative.    Physical Exam Updated Vital Signs BP (!) 160/75 (BP Location: Right Arm)   Pulse 66   Temp 98 F (36.7 C) (Oral)   Resp 20   Ht 5\' 2"  (1.575 m)   Wt 70.8 kg (156 lb)   SpO2 100%   BMI 28.53 kg/m   Physical Exam  Nursing note and vitals reviewed.  82 year old female, resting comfortably and in no acute distress. Vital signs are significant for elevated systolic blood pressure. Oxygen saturation is 100%, which is normal. Head is normocephalic and atraumatic. PERRLA, EOMI. Oropharynx is clear. Neck is nontender and supple without adenopathy or JVD. Back is nontender and there is no CVA tenderness. Lungs are clear without rales, wheezes, or rhonchi. Chest is nontender. Heart has regular rate and rhythm with 2/6 systolic ejection murmur at the left sternal border. Abdomen is soft, flat, nontender without masses or hepatosplenomegaly and peristalsis is normoactive. Extremities: Skin tear left upper arm.  No significant swelling or deformity left ankle.  No tenderness to palpation. Skin is warm and dry without rash. Neurologic:  Mental status is normal, cranial nerves are intact, there are no motor or sensory deficits.  ED Treatments / Results   Radiology Dg Ankle Complete Left  Result Date: 11/30/2017 CLINICAL DATA:  [place], rolled ankle. EXAM: LEFT ANKLE COMPLETE - 3+ VIEW COMPARISON:  LEFT for radiograph April 14, 1999 FINDINGS: No fracture deformity nor dislocation. Medial malleolus enthesopathy. Small plantar calcaneal spur. Achilles insertional enthesopathy. The ankle mortise appears congruent and the tibiofibular syndesmosis intact. No destructive bony lesions. Soft tissue swelling and joint effusion. IMPRESSION: Soft tissue swelling and joint effusion without acute fracture deformity or dislocation. Electronically Signed   By: Elon Alas M.D.   On: 11/30/2017 05:35    Procedures Procedures  Medications Ordered in ED Medications  polymixin-bacitracin (POLYSPORIN) ointment (has no administration in time range)     Initial Impression / Assessment and Plan / ED Course  I have reviewed the triage vital signs and the nursing notes.  Pertinent labs & imaging results that were available during my care of the patient were reviewed by me and considered in my medical decision making (see chart for details).  Fall with no sign of significant injury, but skin tear of the left upper arm.  Old records are reviewed, and she had a tetanus immunization in 2014, no need for booster today.  Dressing is applied to the skin tear.  She is sent for x-rays of her left ankle.  X-rays are negative for fracture.  Patient has been ambulatory since the fall without any difficulty in gait.  Advised to apply antibiotic ointment twice a day, keep wound covered.  Watch for signs of infection.  Final Clinical Impressions(s) / ED Diagnoses   Final diagnoses:  Fall from slip, trip, or stumble, initial encounter  Skin tear of left elbow without complication, initial encounter    ED Discharge Orders    None       Michelle Fuel, MD 83/09/40 [phone]

## 2017-11-30 NOTE — Discharge Instructions (Signed)
Apply antibiotic ointment twice a day. Keep the wound covered. Watch for signs of infection.

## 2017-11-30 NOTE — ED Notes (Signed)
Dressing applied to left arm wound.

## 2017-12-03 DIAGNOSIS — E039 Hypothyroidism, unspecified: Secondary | ICD-10-CM | POA: Diagnosis not present

## 2017-12-03 DIAGNOSIS — I83813 Varicose veins of bilateral lower extremities with pain: Secondary | ICD-10-CM | POA: Diagnosis not present

## 2017-12-03 DIAGNOSIS — Z0000 Encounter for general adult medical examination without abnormal findings: Secondary | ICD-10-CM | POA: Diagnosis not present

## 2017-12-03 DIAGNOSIS — E782 Mixed hyperlipidemia: Secondary | ICD-10-CM | POA: Diagnosis not present

## 2017-12-03 DIAGNOSIS — K5900 Constipation, unspecified: Secondary | ICD-10-CM | POA: Diagnosis not present

## 2017-12-03 DIAGNOSIS — R070 Pain in throat: Secondary | ICD-10-CM | POA: Diagnosis not present

## 2017-12-03 DIAGNOSIS — Z6827 Body mass index (BMI) 27.0-27.9, adult: Secondary | ICD-10-CM | POA: Diagnosis not present

## 2017-12-03 DIAGNOSIS — I10 Essential (primary) hypertension: Secondary | ICD-10-CM | POA: Diagnosis not present

## 2017-12-03 DIAGNOSIS — J040 Acute laryngitis: Secondary | ICD-10-CM | POA: Diagnosis not present

## 2017-12-03 DIAGNOSIS — Z9181 History of falling: Secondary | ICD-10-CM | POA: Diagnosis not present

## 2017-12-03 DIAGNOSIS — H3530 Unspecified macular degeneration: Secondary | ICD-10-CM | POA: Diagnosis not present

## 2017-12-03 DIAGNOSIS — S5002XA Contusion of left elbow, initial encounter: Secondary | ICD-10-CM | POA: Diagnosis not present

## 2018-01-30 DIAGNOSIS — H35032 Hypertensive retinopathy, left eye: Secondary | ICD-10-CM | POA: Diagnosis not present

## 2018-01-30 DIAGNOSIS — H34811 Central retinal vein occlusion, right eye, with macular edema: Secondary | ICD-10-CM | POA: Diagnosis not present

## 2018-01-30 DIAGNOSIS — H43822 Vitreomacular adhesion, left eye: Secondary | ICD-10-CM | POA: Diagnosis not present

## 2018-01-30 DIAGNOSIS — H35373 Puckering of macula, bilateral: Secondary | ICD-10-CM | POA: Diagnosis not present

## 2018-02-27 ENCOUNTER — Other Ambulatory Visit (HOSPITAL_COMMUNITY): Payer: Self-pay | Admitting: Internal Medicine

## 2018-02-27 DIAGNOSIS — Z1231 Encounter for screening mammogram for malignant neoplasm of breast: Secondary | ICD-10-CM

## 2018-02-27 DIAGNOSIS — Z23 Encounter for immunization: Secondary | ICD-10-CM | POA: Diagnosis not present

## 2018-03-16 DIAGNOSIS — H35373 Puckering of macula, bilateral: Secondary | ICD-10-CM | POA: Diagnosis not present

## 2018-03-16 DIAGNOSIS — H3582 Retinal ischemia: Secondary | ICD-10-CM | POA: Diagnosis not present

## 2018-03-16 DIAGNOSIS — H35032 Hypertensive retinopathy, left eye: Secondary | ICD-10-CM | POA: Diagnosis not present

## 2018-03-16 DIAGNOSIS — H34811 Central retinal vein occlusion, right eye, with macular edema: Secondary | ICD-10-CM | POA: Diagnosis not present

## 2018-03-27 ENCOUNTER — Ambulatory Visit (HOSPITAL_COMMUNITY)
Admission: RE | Admit: 2018-03-27 | Discharge: 2018-03-27 | Disposition: A | Discharge status: Home / Self Care | Payer: Medicare Other | Source: Ambulatory Visit | Attending: Internal Medicine | Admitting: Internal Medicine

## 2018-03-27 DIAGNOSIS — Z1231 Encounter for screening mammogram for malignant neoplasm of breast: Secondary | ICD-10-CM | POA: Diagnosis not present

## 2018-04-10 DIAGNOSIS — I10 Essential (primary) hypertension: Secondary | ICD-10-CM | POA: Diagnosis not present

## 2018-04-10 DIAGNOSIS — E039 Hypothyroidism, unspecified: Secondary | ICD-10-CM | POA: Diagnosis not present

## 2018-04-10 DIAGNOSIS — E782 Mixed hyperlipidemia: Secondary | ICD-10-CM | POA: Diagnosis not present

## 2018-04-15 DIAGNOSIS — E039 Hypothyroidism, unspecified: Secondary | ICD-10-CM | POA: Diagnosis not present

## 2018-04-15 DIAGNOSIS — H3530 Unspecified macular degeneration: Secondary | ICD-10-CM | POA: Diagnosis not present

## 2018-04-15 DIAGNOSIS — R7301 Impaired fasting glucose: Secondary | ICD-10-CM | POA: Diagnosis not present

## 2018-04-15 DIAGNOSIS — E782 Mixed hyperlipidemia: Secondary | ICD-10-CM | POA: Diagnosis not present

## 2018-04-15 DIAGNOSIS — J060 Acute laryngopharyngitis: Secondary | ICD-10-CM | POA: Diagnosis not present

## 2018-04-15 DIAGNOSIS — I10 Essential (primary) hypertension: Secondary | ICD-10-CM | POA: Diagnosis not present

## 2018-04-27 DIAGNOSIS — H3582 Retinal ischemia: Secondary | ICD-10-CM | POA: Diagnosis not present

## 2018-04-27 DIAGNOSIS — H35032 Hypertensive retinopathy, left eye: Secondary | ICD-10-CM | POA: Diagnosis not present

## 2018-04-27 DIAGNOSIS — H34811 Central retinal vein occlusion, right eye, with macular edema: Secondary | ICD-10-CM | POA: Diagnosis not present

## 2018-04-27 DIAGNOSIS — H35373 Puckering of macula, bilateral: Secondary | ICD-10-CM | POA: Diagnosis not present

## 2018-07-06 DIAGNOSIS — H34811 Central retinal vein occlusion, right eye, with macular edema: Secondary | ICD-10-CM | POA: Diagnosis not present

## 2018-07-06 DIAGNOSIS — H43822 Vitreomacular adhesion, left eye: Secondary | ICD-10-CM | POA: Diagnosis not present

## 2018-10-15 DIAGNOSIS — H3530 Unspecified macular degeneration: Secondary | ICD-10-CM | POA: Diagnosis not present

## 2018-10-15 DIAGNOSIS — R7301 Impaired fasting glucose: Secondary | ICD-10-CM | POA: Diagnosis not present

## 2018-10-15 DIAGNOSIS — E782 Mixed hyperlipidemia: Secondary | ICD-10-CM | POA: Diagnosis not present

## 2018-10-15 DIAGNOSIS — R030 Elevated blood-pressure reading, without diagnosis of hypertension: Secondary | ICD-10-CM | POA: Diagnosis not present

## 2018-10-15 DIAGNOSIS — E039 Hypothyroidism, unspecified: Secondary | ICD-10-CM | POA: Diagnosis not present

## 2018-11-26 DIAGNOSIS — I10 Essential (primary) hypertension: Secondary | ICD-10-CM | POA: Diagnosis not present

## 2018-11-26 DIAGNOSIS — R7301 Impaired fasting glucose: Secondary | ICD-10-CM | POA: Diagnosis not present

## 2018-11-26 DIAGNOSIS — E782 Mixed hyperlipidemia: Secondary | ICD-10-CM | POA: Diagnosis not present

## 2018-11-26 DIAGNOSIS — Z1329 Encounter for screening for other suspected endocrine disorder: Secondary | ICD-10-CM | POA: Diagnosis not present

## 2018-11-26 DIAGNOSIS — E039 Hypothyroidism, unspecified: Secondary | ICD-10-CM | POA: Diagnosis not present

## 2018-11-30 DIAGNOSIS — R030 Elevated blood-pressure reading, without diagnosis of hypertension: Secondary | ICD-10-CM | POA: Diagnosis not present

## 2018-11-30 DIAGNOSIS — E039 Hypothyroidism, unspecified: Secondary | ICD-10-CM | POA: Diagnosis not present

## 2018-11-30 DIAGNOSIS — H3530 Unspecified macular degeneration: Secondary | ICD-10-CM | POA: Diagnosis not present

## 2018-11-30 DIAGNOSIS — Z0001 Encounter for general adult medical examination with abnormal findings: Secondary | ICD-10-CM | POA: Diagnosis not present

## 2018-11-30 DIAGNOSIS — W57XXXA Bitten or stung by nonvenomous insect and other nonvenomous arthropods, initial encounter: Secondary | ICD-10-CM | POA: Diagnosis not present

## 2018-11-30 DIAGNOSIS — R7301 Impaired fasting glucose: Secondary | ICD-10-CM | POA: Diagnosis not present

## 2018-11-30 DIAGNOSIS — E782 Mixed hyperlipidemia: Secondary | ICD-10-CM | POA: Diagnosis not present

## 2018-11-30 DIAGNOSIS — B3789 Other sites of candidiasis: Secondary | ICD-10-CM | POA: Diagnosis not present

## 2019-02-24 ENCOUNTER — Other Ambulatory Visit (HOSPITAL_COMMUNITY): Payer: Self-pay | Admitting: Internal Medicine

## 2019-02-24 DIAGNOSIS — Z1231 Encounter for screening mammogram for malignant neoplasm of breast: Secondary | ICD-10-CM

## 2019-04-02 ENCOUNTER — Ambulatory Visit (HOSPITAL_COMMUNITY): Payer: Medicare Other

## 2019-04-16 ENCOUNTER — Ambulatory Visit (HOSPITAL_COMMUNITY)
Admission: RE | Admit: 2019-04-16 | Discharge: 2019-04-16 | Disposition: A | Discharge status: Home / Self Care | Payer: Medicare Other | Source: Ambulatory Visit | Attending: Internal Medicine | Admitting: Internal Medicine

## 2019-04-16 ENCOUNTER — Other Ambulatory Visit: Payer: Self-pay

## 2019-04-16 DIAGNOSIS — Z1231 Encounter for screening mammogram for malignant neoplasm of breast: Secondary | ICD-10-CM | POA: Diagnosis not present

## 2019-04-19 DIAGNOSIS — Z23 Encounter for immunization: Secondary | ICD-10-CM | POA: Diagnosis not present

## 2019-05-28 DIAGNOSIS — I10 Essential (primary) hypertension: Secondary | ICD-10-CM | POA: Diagnosis not present

## 2019-05-28 DIAGNOSIS — E039 Hypothyroidism, unspecified: Secondary | ICD-10-CM | POA: Diagnosis not present

## 2019-05-28 DIAGNOSIS — E782 Mixed hyperlipidemia: Secondary | ICD-10-CM | POA: Diagnosis not present

## 2019-05-28 DIAGNOSIS — R7301 Impaired fasting glucose: Secondary | ICD-10-CM | POA: Diagnosis not present

## 2019-08-23 DIAGNOSIS — H43822 Vitreomacular adhesion, left eye: Secondary | ICD-10-CM | POA: Diagnosis not present

## 2019-08-23 DIAGNOSIS — H02132 Senile ectropion of right lower eyelid: Secondary | ICD-10-CM | POA: Diagnosis not present

## 2019-08-23 DIAGNOSIS — H35032 Hypertensive retinopathy, left eye: Secondary | ICD-10-CM | POA: Diagnosis not present

## 2019-08-23 DIAGNOSIS — H34811 Central retinal vein occlusion, right eye, with macular edema: Secondary | ICD-10-CM | POA: Diagnosis not present

## 2019-09-07 DIAGNOSIS — H04522 Eversion of left lacrimal punctum: Secondary | ICD-10-CM | POA: Diagnosis not present

## 2019-09-07 DIAGNOSIS — H04123 Dry eye syndrome of bilateral lacrimal glands: Secondary | ICD-10-CM | POA: Diagnosis not present

## 2019-09-07 DIAGNOSIS — H04521 Eversion of right lacrimal punctum: Secondary | ICD-10-CM | POA: Diagnosis not present

## 2019-09-07 DIAGNOSIS — H02135 Senile ectropion of left lower eyelid: Secondary | ICD-10-CM | POA: Diagnosis not present

## 2019-09-07 DIAGNOSIS — H02132 Senile ectropion of right lower eyelid: Secondary | ICD-10-CM | POA: Diagnosis not present

## 2019-09-07 DIAGNOSIS — H16213 Exposure keratoconjunctivitis, bilateral: Secondary | ICD-10-CM | POA: Diagnosis not present

## 2019-09-07 DIAGNOSIS — H16212 Exposure keratoconjunctivitis, left eye: Secondary | ICD-10-CM | POA: Diagnosis not present

## 2019-09-07 DIAGNOSIS — H02112 Cicatricial ectropion of right lower eyelid: Secondary | ICD-10-CM | POA: Diagnosis not present

## 2019-09-07 DIAGNOSIS — H02142 Spastic ectropion of right lower eyelid: Secondary | ICD-10-CM | POA: Diagnosis not present

## 2019-09-07 DIAGNOSIS — H16211 Exposure keratoconjunctivitis, right eye: Secondary | ICD-10-CM | POA: Diagnosis not present

## 2019-09-07 DIAGNOSIS — H02115 Cicatricial ectropion of left lower eyelid: Secondary | ICD-10-CM | POA: Diagnosis not present

## 2019-09-07 DIAGNOSIS — H02145 Spastic ectropion of left lower eyelid: Secondary | ICD-10-CM | POA: Diagnosis not present

## 2019-09-13 DIAGNOSIS — H60392 Other infective otitis externa, left ear: Secondary | ICD-10-CM | POA: Diagnosis not present

## 2019-09-13 DIAGNOSIS — H906 Mixed conductive and sensorineural hearing loss, bilateral: Secondary | ICD-10-CM | POA: Diagnosis not present

## 2019-09-13 DIAGNOSIS — H90A32 Mixed conductive and sensorineural hearing loss, unilateral, left ear with restricted hearing on the contralateral side: Secondary | ICD-10-CM | POA: Diagnosis not present

## 2019-09-13 DIAGNOSIS — H90A21 Sensorineural hearing loss, unilateral, right ear, with restricted hearing on the contralateral side: Secondary | ICD-10-CM | POA: Diagnosis not present

## 2019-09-13 DIAGNOSIS — H9113 Presbycusis, bilateral: Secondary | ICD-10-CM | POA: Insufficient documentation

## 2019-09-13 DIAGNOSIS — H7292 Unspecified perforation of tympanic membrane, left ear: Secondary | ICD-10-CM | POA: Diagnosis not present

## 2019-09-16 DIAGNOSIS — H02532 Eyelid retraction right lower eyelid: Secondary | ICD-10-CM | POA: Diagnosis not present

## 2019-09-16 DIAGNOSIS — H9192 Unspecified hearing loss, left ear: Secondary | ICD-10-CM | POA: Diagnosis not present

## 2019-09-16 DIAGNOSIS — H3530 Unspecified macular degeneration: Secondary | ICD-10-CM | POA: Diagnosis not present

## 2019-09-22 DIAGNOSIS — H3582 Retinal ischemia: Secondary | ICD-10-CM | POA: Diagnosis not present

## 2019-09-22 DIAGNOSIS — H34811 Central retinal vein occlusion, right eye, with macular edema: Secondary | ICD-10-CM | POA: Diagnosis not present

## 2019-09-22 DIAGNOSIS — H35373 Puckering of macula, bilateral: Secondary | ICD-10-CM | POA: Diagnosis not present

## 2019-09-22 DIAGNOSIS — H43813 Vitreous degeneration, bilateral: Secondary | ICD-10-CM | POA: Diagnosis not present

## 2019-10-04 DIAGNOSIS — H16212 Exposure keratoconjunctivitis, left eye: Secondary | ICD-10-CM | POA: Diagnosis not present

## 2019-10-04 DIAGNOSIS — H04561 Stenosis of right lacrimal punctum: Secondary | ICD-10-CM | POA: Diagnosis not present

## 2019-10-04 DIAGNOSIS — H02142 Spastic ectropion of right lower eyelid: Secondary | ICD-10-CM | POA: Diagnosis not present

## 2019-10-04 DIAGNOSIS — H04521 Eversion of right lacrimal punctum: Secondary | ICD-10-CM | POA: Diagnosis not present

## 2019-10-04 DIAGNOSIS — H02112 Cicatricial ectropion of right lower eyelid: Secondary | ICD-10-CM | POA: Diagnosis not present

## 2019-10-04 DIAGNOSIS — H02132 Senile ectropion of right lower eyelid: Secondary | ICD-10-CM | POA: Diagnosis not present

## 2019-10-04 DIAGNOSIS — H16213 Exposure keratoconjunctivitis, bilateral: Secondary | ICD-10-CM | POA: Diagnosis not present

## 2019-10-04 DIAGNOSIS — H02115 Cicatricial ectropion of left lower eyelid: Secondary | ICD-10-CM | POA: Diagnosis not present

## 2019-10-04 DIAGNOSIS — H02145 Spastic ectropion of left lower eyelid: Secondary | ICD-10-CM | POA: Diagnosis not present

## 2019-10-04 DIAGNOSIS — H10402 Unspecified chronic conjunctivitis, left eye: Secondary | ICD-10-CM | POA: Diagnosis not present

## 2019-10-04 DIAGNOSIS — H11821 Conjunctivochalasis, right eye: Secondary | ICD-10-CM | POA: Diagnosis not present

## 2019-10-04 DIAGNOSIS — H02532 Eyelid retraction right lower eyelid: Secondary | ICD-10-CM | POA: Diagnosis not present

## 2019-10-04 DIAGNOSIS — H04523 Eversion of bilateral lacrimal punctum: Secondary | ICD-10-CM | POA: Diagnosis not present

## 2019-10-04 DIAGNOSIS — H16211 Exposure keratoconjunctivitis, right eye: Secondary | ICD-10-CM | POA: Diagnosis not present

## 2019-10-04 DIAGNOSIS — H04123 Dry eye syndrome of bilateral lacrimal glands: Secondary | ICD-10-CM | POA: Diagnosis not present

## 2019-11-10 DIAGNOSIS — H34811 Central retinal vein occlusion, right eye, with macular edema: Secondary | ICD-10-CM | POA: Diagnosis not present

## 2020-02-09 DIAGNOSIS — Z23 Encounter for immunization: Secondary | ICD-10-CM | POA: Diagnosis not present

## 2020-03-25 DIAGNOSIS — Z23 Encounter for immunization: Secondary | ICD-10-CM | POA: Diagnosis not present

## 2020-06-22 DIAGNOSIS — Z20822 Contact with and (suspected) exposure to covid-19: Secondary | ICD-10-CM | POA: Diagnosis not present

## 2020-07-21 ENCOUNTER — Other Ambulatory Visit (HOSPITAL_COMMUNITY): Payer: Self-pay | Admitting: Internal Medicine

## 2020-07-21 DIAGNOSIS — Z1231 Encounter for screening mammogram for malignant neoplasm of breast: Secondary | ICD-10-CM

## 2020-07-28 ENCOUNTER — Other Ambulatory Visit: Payer: Self-pay

## 2020-07-28 ENCOUNTER — Ambulatory Visit (HOSPITAL_COMMUNITY)
Admission: RE | Admit: 2020-07-28 | Discharge: 2020-07-28 | Disposition: A | Discharge status: Home / Self Care | Payer: Medicare Other | Source: Ambulatory Visit | Attending: Internal Medicine | Admitting: Internal Medicine

## 2020-07-28 DIAGNOSIS — Z1231 Encounter for screening mammogram for malignant neoplasm of breast: Secondary | ICD-10-CM | POA: Diagnosis not present

## 2020-07-29 DIAGNOSIS — Z23 Encounter for immunization: Secondary | ICD-10-CM | POA: Diagnosis not present

## 2020-10-11 DIAGNOSIS — I10 Essential (primary) hypertension: Secondary | ICD-10-CM | POA: Diagnosis not present

## 2020-10-11 DIAGNOSIS — Z6827 Body mass index (BMI) 27.0-27.9, adult: Secondary | ICD-10-CM | POA: Diagnosis not present

## 2020-10-11 DIAGNOSIS — E039 Hypothyroidism, unspecified: Secondary | ICD-10-CM | POA: Diagnosis not present

## 2020-10-11 DIAGNOSIS — E870 Hyperosmolality and hypernatremia: Secondary | ICD-10-CM | POA: Diagnosis not present

## 2020-10-11 DIAGNOSIS — E782 Mixed hyperlipidemia: Secondary | ICD-10-CM | POA: Diagnosis not present

## 2020-10-11 DIAGNOSIS — R7301 Impaired fasting glucose: Secondary | ICD-10-CM | POA: Diagnosis not present

## 2020-10-12 DIAGNOSIS — R030 Elevated blood-pressure reading, without diagnosis of hypertension: Secondary | ICD-10-CM | POA: Diagnosis not present

## 2020-10-12 DIAGNOSIS — E782 Mixed hyperlipidemia: Secondary | ICD-10-CM | POA: Diagnosis not present

## 2020-10-12 DIAGNOSIS — R7301 Impaired fasting glucose: Secondary | ICD-10-CM | POA: Diagnosis not present

## 2020-12-01 DIAGNOSIS — Z23 Encounter for immunization: Secondary | ICD-10-CM | POA: Diagnosis not present

## 2021-03-21 DIAGNOSIS — Z23 Encounter for immunization: Secondary | ICD-10-CM | POA: Diagnosis not present

## 2021-04-20 DIAGNOSIS — R7301 Impaired fasting glucose: Secondary | ICD-10-CM | POA: Diagnosis not present

## 2021-04-20 DIAGNOSIS — E039 Hypothyroidism, unspecified: Secondary | ICD-10-CM | POA: Diagnosis not present

## 2021-04-20 DIAGNOSIS — I10 Essential (primary) hypertension: Secondary | ICD-10-CM | POA: Diagnosis not present

## 2021-04-21 DIAGNOSIS — Z23 Encounter for immunization: Secondary | ICD-10-CM | POA: Diagnosis not present

## 2021-04-26 DIAGNOSIS — E039 Hypothyroidism, unspecified: Secondary | ICD-10-CM | POA: Diagnosis not present

## 2021-04-26 DIAGNOSIS — E782 Mixed hyperlipidemia: Secondary | ICD-10-CM | POA: Diagnosis not present

## 2021-04-26 DIAGNOSIS — F411 Generalized anxiety disorder: Secondary | ICD-10-CM | POA: Diagnosis not present

## 2021-04-26 DIAGNOSIS — R7301 Impaired fasting glucose: Secondary | ICD-10-CM | POA: Diagnosis not present

## 2021-04-26 DIAGNOSIS — I10 Essential (primary) hypertension: Secondary | ICD-10-CM | POA: Diagnosis not present

## 2021-05-08 DIAGNOSIS — M791 Myalgia, unspecified site: Secondary | ICD-10-CM | POA: Diagnosis not present

## 2021-05-08 DIAGNOSIS — J09X2 Influenza due to identified novel influenza A virus with other respiratory manifestations: Secondary | ICD-10-CM | POA: Diagnosis not present

## 2021-05-08 DIAGNOSIS — R509 Fever, unspecified: Secondary | ICD-10-CM | POA: Diagnosis not present

## 2021-05-08 DIAGNOSIS — J029 Acute pharyngitis, unspecified: Secondary | ICD-10-CM | POA: Diagnosis not present

## 2021-05-08 DIAGNOSIS — J069 Acute upper respiratory infection, unspecified: Secondary | ICD-10-CM | POA: Diagnosis not present

## 2021-07-24 ENCOUNTER — Other Ambulatory Visit (HOSPITAL_COMMUNITY): Payer: Self-pay | Admitting: Internal Medicine

## 2021-07-24 DIAGNOSIS — Z1231 Encounter for screening mammogram for malignant neoplasm of breast: Secondary | ICD-10-CM

## 2021-08-03 ENCOUNTER — Other Ambulatory Visit: Payer: Self-pay

## 2021-08-03 ENCOUNTER — Ambulatory Visit (HOSPITAL_COMMUNITY)
Admission: RE | Admit: 2021-08-03 | Discharge: 2021-08-03 | Disposition: A | Discharge status: Home / Self Care | Payer: Medicare Other | Source: Ambulatory Visit | Attending: Internal Medicine | Admitting: Internal Medicine

## 2021-08-03 DIAGNOSIS — Z1231 Encounter for screening mammogram for malignant neoplasm of breast: Secondary | ICD-10-CM | POA: Diagnosis not present

## 2021-11-02 DIAGNOSIS — E039 Hypothyroidism, unspecified: Secondary | ICD-10-CM | POA: Diagnosis not present

## 2021-11-02 DIAGNOSIS — R7301 Impaired fasting glucose: Secondary | ICD-10-CM | POA: Diagnosis not present

## 2021-11-02 DIAGNOSIS — E782 Mixed hyperlipidemia: Secondary | ICD-10-CM | POA: Diagnosis not present

## 2021-11-02 DIAGNOSIS — I10 Essential (primary) hypertension: Secondary | ICD-10-CM | POA: Diagnosis not present

## 2021-11-07 DIAGNOSIS — I10 Essential (primary) hypertension: Secondary | ICD-10-CM | POA: Diagnosis not present

## 2021-11-07 DIAGNOSIS — E039 Hypothyroidism, unspecified: Secondary | ICD-10-CM | POA: Diagnosis not present

## 2021-11-07 DIAGNOSIS — E782 Mixed hyperlipidemia: Secondary | ICD-10-CM | POA: Diagnosis not present

## 2021-11-07 DIAGNOSIS — R7301 Impaired fasting glucose: Secondary | ICD-10-CM | POA: Diagnosis not present

## 2021-11-07 DIAGNOSIS — F411 Generalized anxiety disorder: Secondary | ICD-10-CM | POA: Diagnosis not present

## 2022-03-14 DIAGNOSIS — Z23 Encounter for immunization: Secondary | ICD-10-CM | POA: Diagnosis not present

## 2022-04-15 DIAGNOSIS — R600 Localized edema: Secondary | ICD-10-CM | POA: Diagnosis not present

## 2022-04-15 DIAGNOSIS — F411 Generalized anxiety disorder: Secondary | ICD-10-CM | POA: Diagnosis not present

## 2022-05-14 DIAGNOSIS — J069 Acute upper respiratory infection, unspecified: Secondary | ICD-10-CM | POA: Diagnosis not present

## 2022-05-14 DIAGNOSIS — H6692 Otitis media, unspecified, left ear: Secondary | ICD-10-CM | POA: Diagnosis not present

## 2022-05-17 DIAGNOSIS — I10 Essential (primary) hypertension: Secondary | ICD-10-CM | POA: Diagnosis not present

## 2022-05-17 DIAGNOSIS — E039 Hypothyroidism, unspecified: Secondary | ICD-10-CM | POA: Diagnosis not present

## 2022-05-17 DIAGNOSIS — R7301 Impaired fasting glucose: Secondary | ICD-10-CM | POA: Diagnosis not present

## 2022-06-01 DIAGNOSIS — Z23 Encounter for immunization: Secondary | ICD-10-CM | POA: Diagnosis not present

## 2022-06-01 DIAGNOSIS — R600 Localized edema: Secondary | ICD-10-CM | POA: Diagnosis not present

## 2022-06-03 ENCOUNTER — Other Ambulatory Visit (HOSPITAL_COMMUNITY): Payer: Self-pay | Admitting: Family Medicine

## 2022-06-03 DIAGNOSIS — R600 Localized edema: Secondary | ICD-10-CM

## 2022-06-14 ENCOUNTER — Ambulatory Visit (HOSPITAL_COMMUNITY)
Admission: RE | Admit: 2022-06-14 | Discharge: 2022-06-14 | Disposition: A | Discharge status: Home / Self Care | Payer: Medicare Other | Source: Ambulatory Visit | Attending: Family Medicine | Admitting: Family Medicine

## 2022-06-14 DIAGNOSIS — R600 Localized edema: Secondary | ICD-10-CM | POA: Diagnosis not present

## 2022-06-18 DIAGNOSIS — R7301 Impaired fasting glucose: Secondary | ICD-10-CM | POA: Diagnosis not present

## 2022-06-18 DIAGNOSIS — E870 Hyperosmolality and hypernatremia: Secondary | ICD-10-CM | POA: Diagnosis not present

## 2022-06-18 DIAGNOSIS — E039 Hypothyroidism, unspecified: Secondary | ICD-10-CM | POA: Diagnosis not present

## 2022-06-18 DIAGNOSIS — Z713 Dietary counseling and surveillance: Secondary | ICD-10-CM | POA: Diagnosis not present

## 2022-06-18 DIAGNOSIS — R600 Localized edema: Secondary | ICD-10-CM | POA: Diagnosis not present

## 2022-06-18 DIAGNOSIS — F411 Generalized anxiety disorder: Secondary | ICD-10-CM | POA: Diagnosis not present

## 2022-06-18 DIAGNOSIS — E782 Mixed hyperlipidemia: Secondary | ICD-10-CM | POA: Diagnosis not present

## 2022-06-18 DIAGNOSIS — Z6827 Body mass index (BMI) 27.0-27.9, adult: Secondary | ICD-10-CM | POA: Diagnosis not present

## 2022-06-18 DIAGNOSIS — I10 Essential (primary) hypertension: Secondary | ICD-10-CM | POA: Diagnosis not present

## 2022-07-17 DIAGNOSIS — I10 Essential (primary) hypertension: Secondary | ICD-10-CM | POA: Diagnosis not present

## 2022-07-17 DIAGNOSIS — R600 Localized edema: Secondary | ICD-10-CM | POA: Diagnosis not present

## 2022-07-17 DIAGNOSIS — Z7182 Exercise counseling: Secondary | ICD-10-CM | POA: Diagnosis not present

## 2022-07-17 DIAGNOSIS — Z713 Dietary counseling and surveillance: Secondary | ICD-10-CM | POA: Diagnosis not present

## 2022-08-06 ENCOUNTER — Other Ambulatory Visit (HOSPITAL_COMMUNITY): Payer: Self-pay | Admitting: Internal Medicine

## 2022-08-06 DIAGNOSIS — I10 Essential (primary) hypertension: Secondary | ICD-10-CM | POA: Diagnosis not present

## 2022-08-06 DIAGNOSIS — Z1231 Encounter for screening mammogram for malignant neoplasm of breast: Secondary | ICD-10-CM

## 2022-08-06 DIAGNOSIS — R600 Localized edema: Secondary | ICD-10-CM | POA: Diagnosis not present

## 2022-08-16 ENCOUNTER — Ambulatory Visit (HOSPITAL_COMMUNITY): Payer: Medicare Other

## 2022-08-30 ENCOUNTER — Ambulatory Visit (HOSPITAL_COMMUNITY)
Admission: RE | Admit: 2022-08-30 | Discharge: 2022-08-30 | Disposition: A | Discharge status: Home / Self Care | Payer: Medicare Other | Source: Ambulatory Visit | Attending: Internal Medicine | Admitting: Internal Medicine

## 2022-08-30 DIAGNOSIS — Z1231 Encounter for screening mammogram for malignant neoplasm of breast: Secondary | ICD-10-CM | POA: Diagnosis not present

## 2022-09-06 DIAGNOSIS — I10 Essential (primary) hypertension: Secondary | ICD-10-CM | POA: Diagnosis not present

## 2022-09-06 DIAGNOSIS — E870 Hyperosmolality and hypernatremia: Secondary | ICD-10-CM | POA: Diagnosis not present

## 2022-09-06 DIAGNOSIS — Z7182 Exercise counseling: Secondary | ICD-10-CM | POA: Diagnosis not present

## 2022-09-06 DIAGNOSIS — Z713 Dietary counseling and surveillance: Secondary | ICD-10-CM | POA: Diagnosis not present

## 2022-09-06 DIAGNOSIS — R600 Localized edema: Secondary | ICD-10-CM | POA: Diagnosis not present

## 2022-09-06 DIAGNOSIS — Z6826 Body mass index (BMI) 26.0-26.9, adult: Secondary | ICD-10-CM | POA: Diagnosis not present

## 2023-05-14 IMAGING — MG MM DIGITAL SCREENING BILAT W/ TOMO AND CAD
8 series · 9 of 24 positions shown · non-contrast
Comparison: Previous exam(s).

CLINICAL DATA: Screening.

EXAM:
DIGITAL SCREENING BILATERAL MAMMOGRAM WITH TOMOSYNTHESIS AND CAD
TECHNIQUE: Bilateral screening digital craniocaudal and mediolateral oblique
mammograms were obtained. Bilateral screening digital breast
tomosynthesis was performed. The images were evaluated with
computer-aided detection.

[R CC synth-2D]
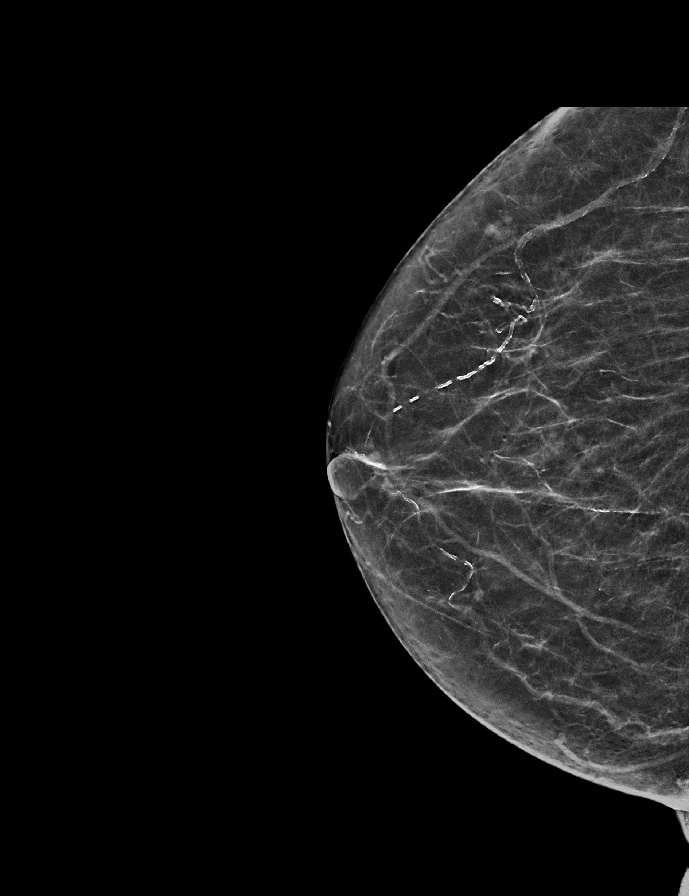

[R MLO synth-2D]
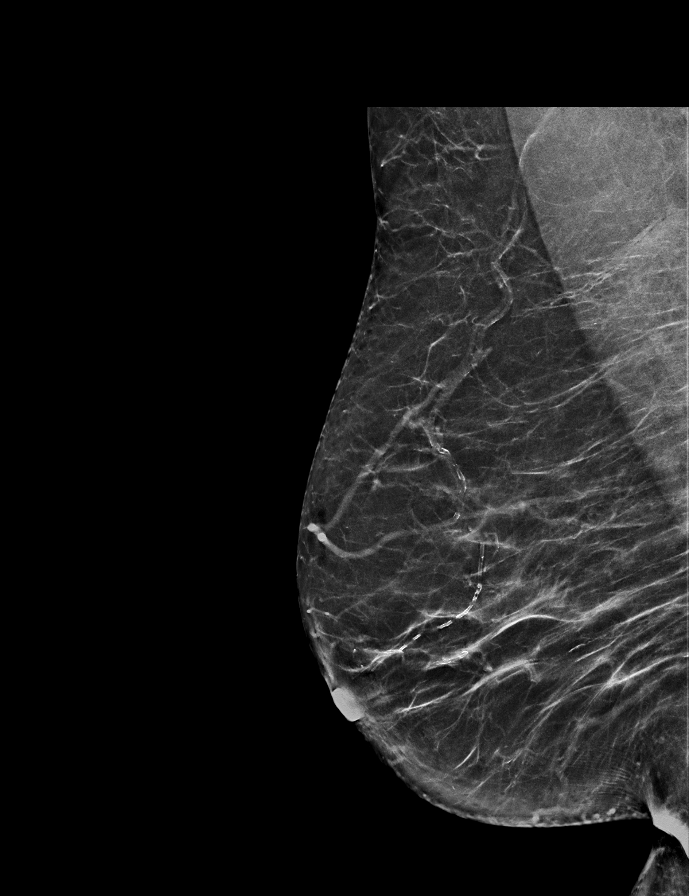

[L CC synth-2D]
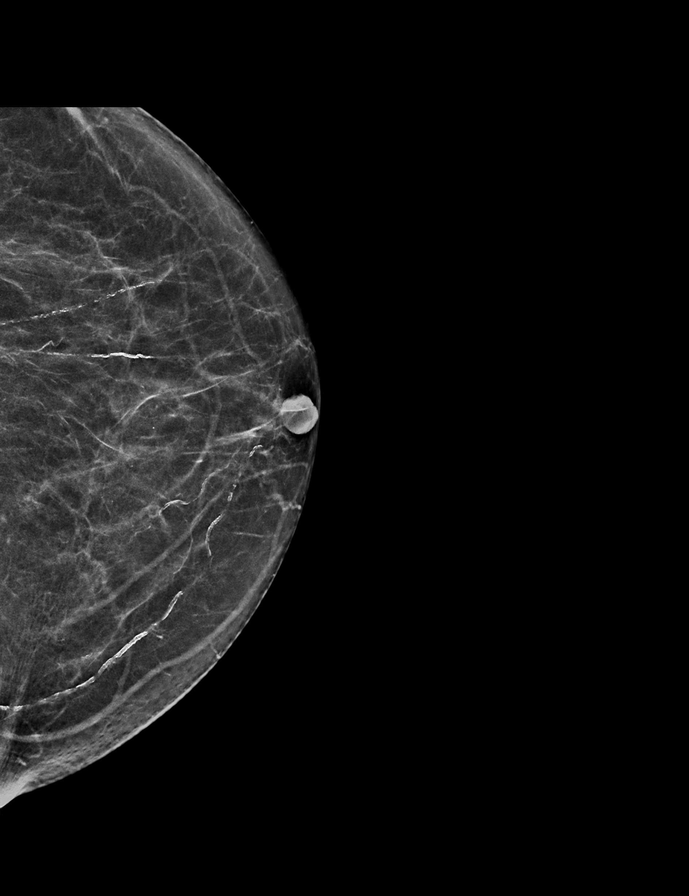

[L MLO synth-2D]
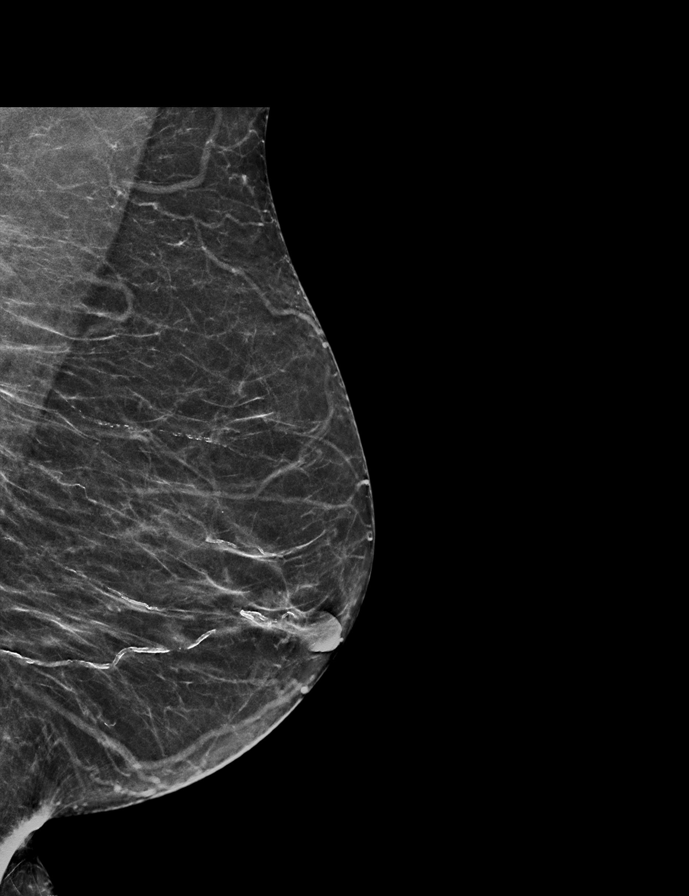

[R MLO tomo · 2 of 60 frames shown]
[frame 20/60]
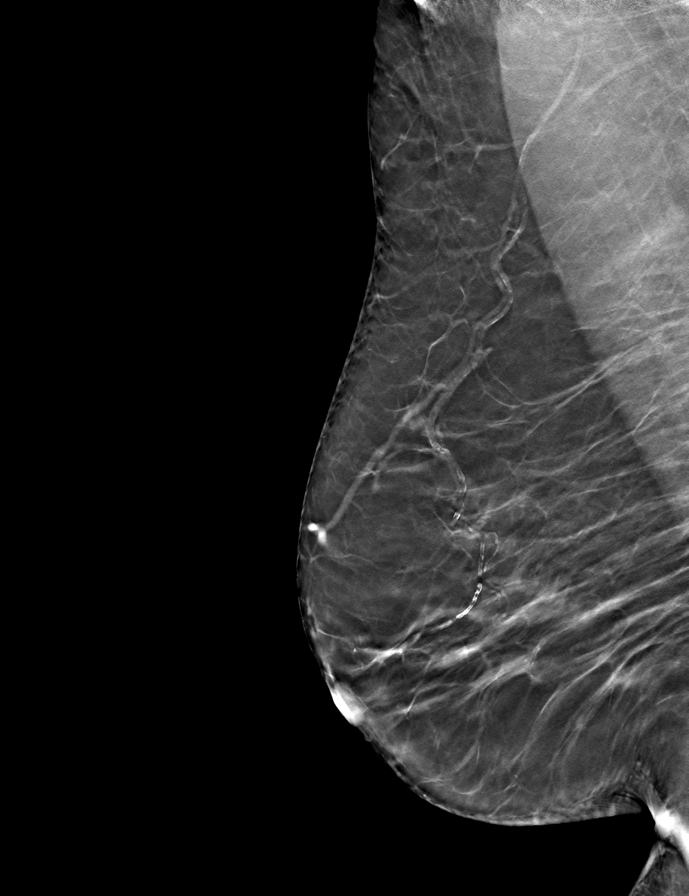
[frame 31/60]
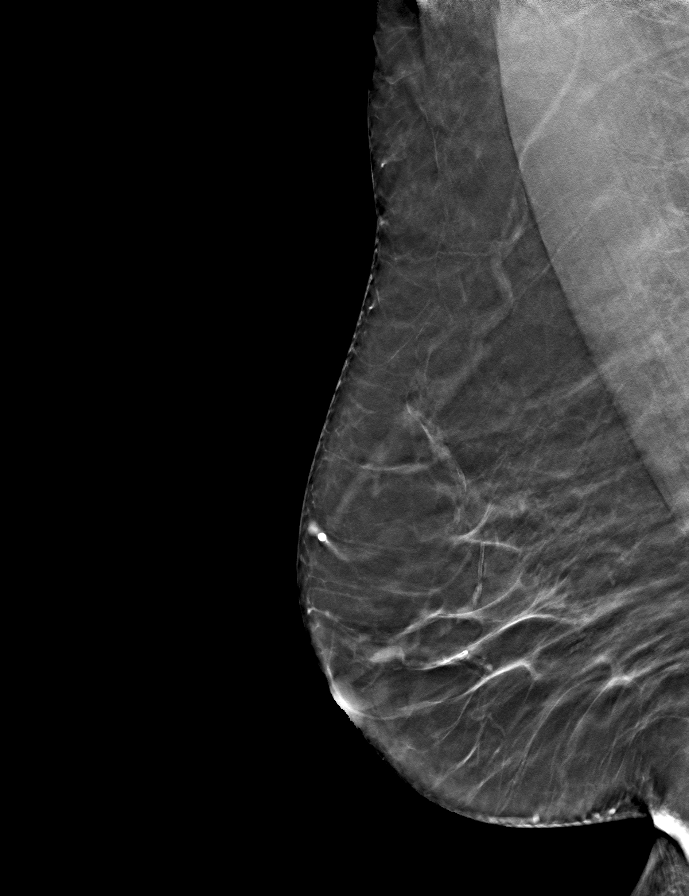

[L MLO tomo · tomo slice 27/53.0]
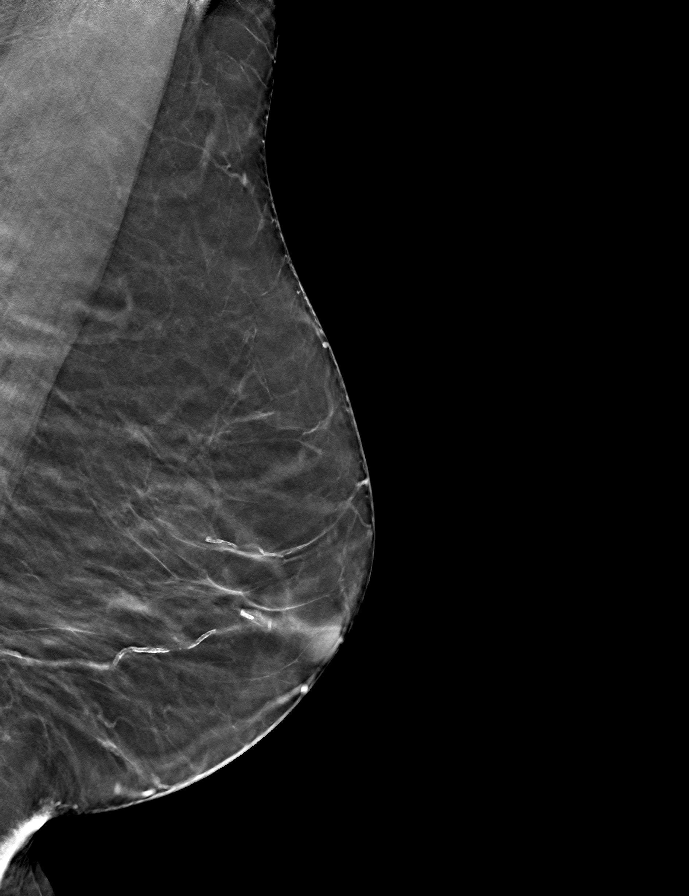

[R CC tomo · tomo slice 26/51.0]
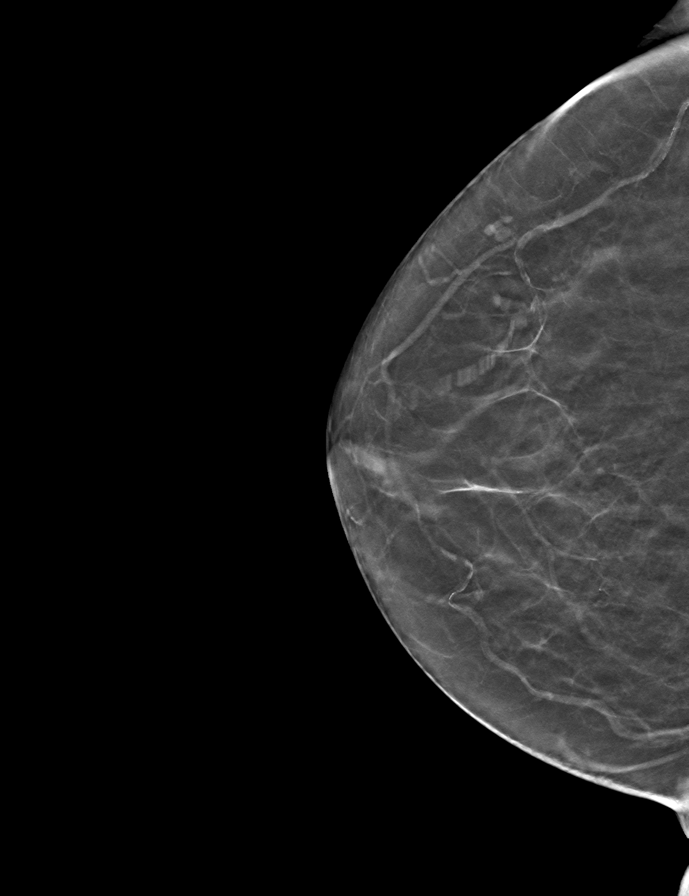

[L CC tomo · tomo slice 27/53.0]
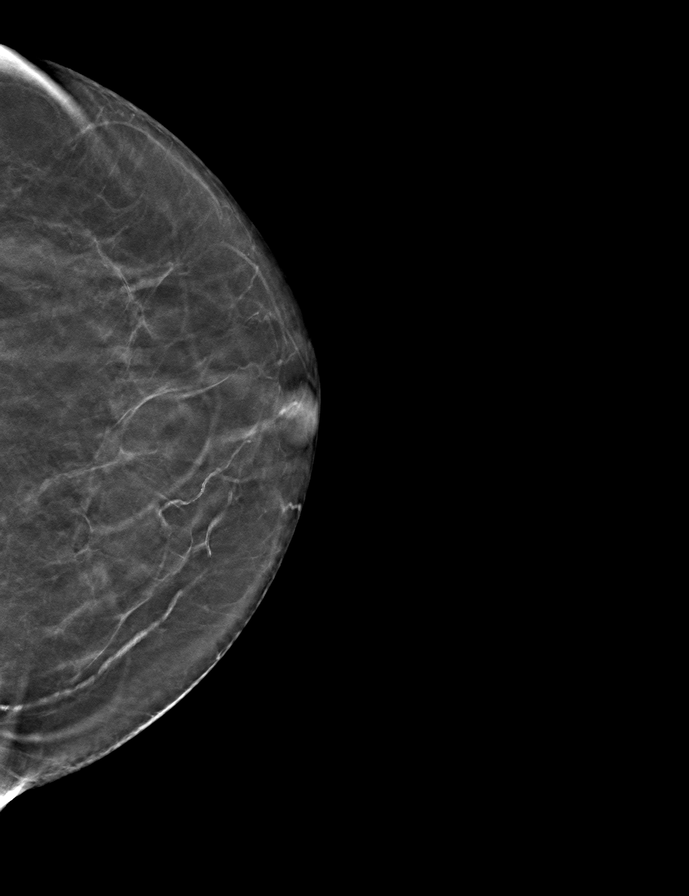

[9 of 24 positions shown; findings below may reference images not displayed]

ACR Breast Density Category b: There are scattered areas of
fibroglandular density.
FINDINGS: There are no findings suspicious for malignancy.
IMPRESSION: No mammographic evidence of malignancy. A result letter of this
screening mammogram will be mailed directly to the patient.

RECOMMENDATION:
Screening mammogram in one year. (Code:51-O-LD2)

BI-RADS CATEGORY  1: Negative.

## 2023-09-17 ENCOUNTER — Telehealth (HOSPITAL_BASED_OUTPATIENT_CLINIC_OR_DEPARTMENT_OTHER): Payer: Self-pay

## 2023-09-17 ENCOUNTER — Encounter (HOSPITAL_BASED_OUTPATIENT_CLINIC_OR_DEPARTMENT_OTHER): Payer: Self-pay | Admitting: Student

## 2023-09-17 ENCOUNTER — Other Ambulatory Visit (HOSPITAL_BASED_OUTPATIENT_CLINIC_OR_DEPARTMENT_OTHER): Payer: Self-pay

## 2023-09-17 ENCOUNTER — Ambulatory Visit (INDEPENDENT_AMBULATORY_CARE_PROVIDER_SITE_OTHER): Admitting: Student

## 2023-09-17 DIAGNOSIS — S82842A Displaced bimalleolar fracture of left lower leg, initial encounter for closed fracture: Secondary | ICD-10-CM

## 2023-09-17 MED ORDER — HYDROCODONE-ACETAMINOPHEN 5-325 MG PO TABS
1.0000 | ORAL_TABLET | Freq: Four times a day (QID) | ORAL | 0 refills | Status: DC | PRN
  Filled 2023-09-17: qty 12, 3d supply, fill #0

## 2023-09-17 NOTE — Progress Notes (Signed)
 Chief Complaint: Left ankle injury     History of Present Illness:    Michelle Bradford is a 88 y.o. female presenting today with her daughter Hilda Lias for evaluation of an acute left ankle injury.  Patient does have some early stage dementia therefore majority of history was provided by her daughter.  In the middle of the night last night, patient slipped on a wet floor and sustained a fall.  She notified her daughter this morning that her left leg was in significant pain.  They did seek evaluation in urgent care this afternoon and was placed in a short leg splint after x-rays revealed a fracture.  She was given a Toradol injection however has not taken anything else for pain.  She does rate pain as moderate to severe although the Toradol has given some relief.  Does have some numbness and tingling distally but daughter states that she had strong pulses prior to splinting.   Surgical History:   None  PMH/PSH/Family History/Social History/Meds/Allergies:    Past Medical History:  Diagnosis Date   Hyperlipidemia    Hypothyroidism    Osteoporosis    Past Surgical History:  Procedure Laterality Date   ABDOMINAL HYSTERECTOMY     CATARACT EXTRACTION W/PHACO Left 05/01/2015   Procedure: CATARACT EXTRACTION PHACO AND INTRAOCULAR LENS PLACEMENT LEFT EYE;  Surgeon: Gemma Payor, MD;  Location: [facility];  Service: Ophthalmology;  Laterality: Left;  CDE:7.51   CATARACT EXTRACTION W/PHACO Right 05/29/2015   Procedure: CATARACT EXTRACTION PHACO AND INTRAOCULAR LENS PLACEMENT RIGHT EYE;  CDE:  9.91;  Surgeon: Gemma Payor, MD;  Location: [facility];  Service: Ophthalmology;  Laterality: Right;   HEMORROIDECTOMY     LAPAROTOMY     removal of fibroids   OPEN REDUCTION INTERNAL FIXATION (ORIF) HAND Left    wrist; has plates and screws in it.   SHOULDER ARTHROSCOPY Right    had a frozen shoulder   Social History   Socioeconomic History   Marital status: Widowed    Spouse  name: Not on file   Number of children: Not on file   Years of education: Not on file   Highest education level: Not on file  Occupational History   Not on file  Tobacco Use   Smoking status: Former    Current packs/day: 0.00    Average packs/day: 0.5 packs/day for 20.0 years (10.0 ttl pk-yrs)    Types: Cigarettes    Start date: 04/25/1992    Quit date: 04/25/2012    Years since quitting: 11.4   Smokeless tobacco: Never  Substance and Sexual Activity   Alcohol use: No   Drug use: No   Sexual activity: Never  Other Topics Concern   Not on file  Social History Narrative   Not on file   Social Drivers of Health   Financial Resource Strain: Not on file  Food Insecurity: Not on file  Transportation Needs: Not on file  Physical Activity: Not on file  Stress: Not on file  Social Connections: Not on file   History reviewed. No pertinent family history. No Known Allergies Current Outpatient Medications  Medication Sig Dispense Refill   HYDROcodone-acetaminophen (NORCO/VICODIN) 5-325 MG tablet Take 1 tablet by mouth every 6 (six) hours as needed for up to 3 days for moderate pain (pain score 4-6). 12  tablet 0   acetaminophen (TYLENOL) 500 MG tablet Take 500 mg by mouth every 6 (six) hours as needed for mild pain.     levothyroxine (SYNTHROID, LEVOTHROID) 25 MCG tablet Take 25 mcg by mouth daily before breakfast.     simvastatin (ZOCOR) 40 MG tablet Take 40 mg by mouth daily.     No current facility-administered medications for this visit.   No results found.  Review of Systems:   A ROS was performed including pertinent positives and negatives as documented in the HPI.  Physical Exam :   Constitutional: NAD and appears stated age Neurological: Alert and oriented Psych: Appropriate affect and cooperative There were no vitals taken for this visit.   Comprehensive Musculoskeletal Exam:    Patient is seated in a wheelchair with a short leg splint over the left lower extremity.   Brisk capillary refill and intact motor exam to all 5 toes.  Imaging:   Xray review from urgent care (left ankle 3 views): Bimalleolar fracture involving and mildly displaced oblique distal fibular fracture above the syndesmosis.  Avulsion fracture of the inferior tip of the medial malleolus.  Widening of the ankle mortise noted medially.   I personally reviewed and interpreted the radiographs.   Assessment:   88 y.o. female with an acute ankle injury after a fall at home last night.  X-rays reviewed today from urgent care does unfortunately show a bimalleolar fracture with widening of the ankle mortise.  Medial malleolus appears consistent with an avulsion fracture suggesting deltoid ligament injury.  She does appear neurovascularly intact.  Discussed that unfortunately these are unstable ankle injuries by nature and this will likely require surgical intervention in order to stabilize the ankle and allow for healing.  I will see if we are able to get this done within the next few days in order to expedite recovery.  I will plan to leave her in the splint until surgery but discussed that she will then be transitioned into a walking boot.  Daughter is concerned regarding pain, particularly for sleep, so I will send a few Norco and to just use it as needed but otherwise encouraged use of Tylenol.  Patient and daughter are in understanding and agreement with plan.  Plan :    -Plan for left ankle ORIF -Norco 5-325 provided today for moderate to severe pain as needed     I personally saw and evaluated the patient, and participated in the management and treatment plan.  Hazle Nordmann, PA-C Orthopedics

## 2023-09-17 NOTE — Telephone Encounter (Signed)
 Patient came in to the same day clinic today and needs left ankle ORIF . Patient lives in [place] and Dr. Steward Drone doesn't have time to do the surgery. Are you able to get her in to see Dr. Dallas Schimke for surgery asap?

## 2023-09-18 ENCOUNTER — Telehealth: Payer: Self-pay | Admitting: Orthopedic Surgery

## 2023-09-18 ENCOUNTER — Ambulatory Visit: Payer: Self-pay | Admitting: Orthopedic Surgery

## 2023-09-18 DIAGNOSIS — Z01818 Encounter for other preprocedural examination: Secondary | ICD-10-CM

## 2023-09-18 DIAGNOSIS — S82842A Displaced bimalleolar fracture of left lower leg, initial encounter for closed fracture: Secondary | ICD-10-CM

## 2023-09-18 NOTE — Telephone Encounter (Signed)
 Received a message from Leta/Dr. Dallas Schimke to schedule this patient for tomorrow 09/19/23.  I spoke w/the daughter Hilda Lias [phone] and scheduled her for 09/19/23.  Cira Servant that the patient needs to keep the splint on, do not take it off, ice on the outside of the splint, do not get it wet and keep it elevated.  She stated that the patient is taking Hydrocodone 5-325 and two 500mg  Tylenol every six hours for pain.  Advised her that her surgery will be on Monday, 09/22/23 and that someone will be reaching out to her regarding the surgery.

## 2023-09-18 NOTE — Patient Instructions (Signed)
 Michelle Bradford  09/18/2023     @PREFPERIOPPHARMACY @   Your procedure is scheduled on  09/22/2023.   Report to [facility] at  0600 A.M.   Call this number if you have problems the morning of surgery:  [phone]  If you experience any cold or flu symptoms such as cough, fever, chills, shortness of breath, etc. between now and your scheduled surgery, please notify us at the above number.   Remember:  Do not eat after midnight.   You may drink clear liquids until  0330 am on 09/22/2023.    Clear liquids allowed are:                    Water, Juice (No red color; non-citric and without pulp; diabetics please choose diet or no sugar options), Carbonated beverages (diabetics please choose diet or no sugar options), Clear Tea (No creamer, milk, or cream, including half & half and powdered creamer), Black Coffee Only (No creamer, milk or cream, including half & half and powdered creamer), and Clear Sports drink (No red color; diabetics please choose diet or no sugar options)         At 0330 am on 09/22/2023 drink your carb drink. You can have nothing to drink after this.    Take these medicines the morning of surgery with A SIP OF WATER                          hydrocodone (if needed), levothyroxine.    Do not wear jewelry, make-up or nail polish, including gel polish,  artificial nails, or any other type of covering on natural nails (fingers and  toes).  Do not wear lotions, powders, or perfumes, or deodorant.  Do not shave 48 hours prior to surgery.  Men may shave face and neck.  Do not bring valuables to the hospital.  [facility] is not responsible for any belongings or valuables.  Contacts, dentures or bridgework may not be worn into surgery.  Leave your suitcase in the car.  After surgery it may be brought to your room.  For patients admitted to the hospital, discharge time will be determined by your treatment team.  Patients discharged the day of surgery will not be  allowed to drive home and must have someone with them for 24 hours.    Special instructions:   DO NOT smoke tobacco or vape for 24 hours before your procedure.  Please read over the following fact sheets that you were given. Coughing and Deep Breathing, Surgical Site Infection Prevention, Anesthesia Post-op Instructions, and Care and Recovery After Surgery         ORIF Surgery for an Ankle Broken in Two Places (Bimalleolar Ankle Fracture): What to Know After After having a surgery called ORIF to treat a broken ankle, it's common to have some pain and swelling. You may also have a small amount of fluid coming from your cut for surgery. Follow these instructions at home: Medicines Take your medicines only as told. Take your antibiotics as told. Do not stop taking them even if you start to feel better. You may need to take steps to help treat or prevent trouble pooping (constipation), such as: Taking medicines to help you poop. Eating foods high in fiber, like beans, whole grains, and fresh fruits and vegetables. Drinking more fluids as told. Ask your health care provider if it's safe to drive or use  machines while taking your medicine. If you have a splint or boot that can be taken off: Wear the splint or boot as told. Take it off only if your provider says you can. Check the skin around it every day. Tell your provider if you see problems. Loosen the splint or boot if your toes tingle, are numb, or turn cold and blue. Keep the splint or boot clean and dry. If you have a cast or splint that can't be taken off: Do not put pressure on any part of the cast or splint until it's hard. This may take a few hours. Do not stick anything inside it to scratch your skin. Doing this can lead to infection. Check the skin around your cast or splint every day. Tell your provider if you see problems. It's OK to put lotion on dry skin around the cast or splint. Keep the cast or splint clean and  dry. Bathing Do not take baths, swim, or use a hot tub until you're told it's OK. Ask if you can shower. If your splint, boot, or cast isn't waterproof: Do not let it get wet. Cover it when you take a bath or a shower. Use a cover that doesn't let any water in. Caring for your cut from surgery  Take care of your cut as told. Make sure you: Wash your hands with soap and water for at least 20 seconds before and after you change your bandage. If you can't use soap and water, use hand sanitizer. Change your bandage. Leave stitches or skin glue alone. Leave tape strips alone unless you're told to take them off. You may trim the edges of the tape strips if they curl up. Check the area around your cut every day for signs of infection. Check for: More redness, swelling, or pain. More fluid or blood. Warmth. Pus or a bad smell. Managing pain, stiffness, and swelling  Use ice or an ice pack as told. If you have a splint or boot that you can take off, remove it only as told. Place a towel between your skin and the ice or between your cast and the ice. Leave the ice on for 20 minutes, 2-3 times a day. If your skin turns red, take off the ice right away to prevent skin damage. The risk of damage is higher if you can't feel pain, heat, or cold. Move your toes often to reduce stiffness and swelling. Raise your ankle above the level of your heart while you're sitting or lying down. Use pillows as needed. Activity Do not stand or walk on your injured ankle until you're told it's OK. Use crutches, a scooter, or a wheelchair. Only put as much weight on your foot as told. Ask when it's safe to drive if you have a splint, boot, or cast on your foot. Exercise as told. Rest as told. Get up to take short walks at least every 2 hours during the day. This helps you breathe better and keeps your blood flowing. Ask for help if you feel weak or unsteady. Ask what things are safe for you to do at home. Ask when  you can go back to work or school. General instructions Do not smoke, vape, or use nicotine or tobacco. Contact a health care provider if: You have a fever. Your pain medicine isn't helping. You have any signs of infection near your cut. The edges of your cut come apart after the stitches or staples are taken out. Get help right  away if: You have chest pain. You have trouble breathing. Your foot or leg feels numb or tingles. Your foot is: Cold. Pale. Blue. You have calf swelling or tenderness. These symptoms may be an emergency. Call 911 right away. Do not wait to see if the symptoms will go away. Do not drive yourself to the hospital. This information is not intended to replace advice given to you by your health care provider. Make sure you discuss any questions you have with your health care provider. Document Revised: 01/14/2023 Document Reviewed: 01/14/2023 Elsevier Patient Education  2024 Else[employer].General Anesthesia, Adult, Care After The following information offers guidance on how to care for yourself after your procedure. Your health care provider may also give you more specific instructions. If you have problems or questions, contact your health care provider. What can I expect after the procedure? After the procedure, it is common for people to: Have pain or discomfort at the IV site. Have nausea or vomiting. Have a sore throat or hoarseness. Have trouble concentrating. Feel cold or chills. Feel weak, sleepy, or tired (fatigue). Have soreness and body aches. These can affect parts of the body that were not involved in surgery. Follow these instructions at home: For the time period you were told by your health care provider:  Rest. Do not participate in activities where you could fall or become injured. Do not drive or use machinery. Do not drink alcohol. Do not take sleeping pills or medicines that cause drowsiness. Do not make important decisions or sign legal  documents. Do not take care of children on your own. General instructions Drink enough fluid to keep your urine pale yellow. If you have sleep apnea, surgery and certain medicines can increase your risk for breathing problems. Follow instructions from your health care provider about wearing your sleep device: Anytime you are sleeping, including during daytime naps. While taking prescription pain medicines, sleeping medicines, or medicines that make you drowsy. Return to your normal activities as told by your health care provider. Ask your health care provider what activities are safe for you. Take over-the-counter and prescription medicines only as told by your health care provider. Do not use any products that contain nicotine or tobacco. These products include cigarettes, chewing tobacco, and vaping devices, such as e-cigarettes. These can delay incision healing after surgery. If you need help quitting, ask your health care provider. Contact a health care provider if: You have nausea or vomiting that does not get better with medicine. You vomit every time you eat or drink. You have pain that does not get better with medicine. You cannot urinate or have bloody urine. You develop a skin rash. You have a fever. Get help right away if: You have trouble breathing. You have chest pain. You vomit blood. These symptoms may be an emergency. Get help right away. Call 911. Do not wait to see if the symptoms will go away. Do not drive yourself to the hospital. Summary After the procedure, it is common to have a sore throat, hoarseness, nausea, vomiting, or to feel weak, sleepy, or fatigue. For the time period you were told by your health care provider, do not drive or use machinery. Get help right away if you have difficulty breathing, have chest pain, or vomit blood. These symptoms may be an emergency. This information is not intended to replace advice given to you by your health care provider.  Make sure you discuss any questions you have with your health care provider. Document  Revised: 08/31/2021 Document Reviewed: 08/31/2021 Elsevier Patient Education  2024 Elsevier Inc. How to Use Chlorhexidine Prepackaged Cloths at Home Chlorhexidine gluconate (CHG) is a germ-killing (antiseptic) wash that is used to clean the skin. It can get rid of the germs that normally live on the skin and can keep them away for about 24 hours. One way to clean your skin with CHG is by using prepackaged CHG cloths. The following information gives steps on how to use CHG cloths to clean your skin. Supplies needed: CHG cloths. How to clean your whole body with CHG cloths Follow the order of these steps unless your health care provider gives you other instructions: Use a new CHG cloth for each part of your body and after each step. Wash between any folds of skin, under your breasts, and between your fingers and toes. Front of neck, shoulders, and chest. Do not get CHG in your eyes, mouth, and ears. If CHG gets into your eyes or ears, rinse them well with water. Both arms, armpits, and hands. Stomach and groin. Right leg and foot. Left leg and foot. Back of neck and back. Do not rinse. Let your skin air dry. Do not put anything on your body afterward, such as powder or perfume. Use lotion only as told by your provider. How to clean your surgery area with CHG cloths Follow these steps when using CHG cloths to clean before surgery unless your provider gives you other instructions: Using the CHG cloths, vigorously wash the part of your body where the surgery will be done. Use a back-and-forth motion for 2 minutes. The skin should be completely wet with CHG when you are done. Do not put anything on your body afterward, such as powder, lotion, or perfume. Put on clean clothes or pajamas. If it is the night before surgery, sleep in clean sheets. General tips Only use CHG cloths as told, and follow the  instructions on the label. Use the CHG cloths on clean, dry skin. Do not smoke and stay away from flames after using CHG. Your skin may feel sticky after using the CHG. This is normal. The sticky feeling will go away as the CHG dries. Do not use CHG: If you have a chlorhexidine allergy or have reacted to chlorhexidine in the past. On babies younger than 67 months of age. On your head, face, or genitals unless your provider tells you to. Contact a health care provider if: You have questions about using CHG. Your skin gets irritated or itchy. You have a rash after using CHG. You swallow any CHG. Call your local poison control center ([phone] in the [place] Your eyes itch badly, or they become very red or swollen. Your hearing changes. You have trouble seeing. Get help right away if: You have swelling or tingling in your mouth or throat. You make high-pitched whistling sounds when you breathe, most often when you breathe out (wheeze). You have trouble breathing. These symptoms may be an emergency. Call 911right away. Do not wait to see if the symptoms will go away. Do not drive yourself to the hospital. This information is not intended to replace advice given to you by your health care provider. Make sure you discuss any questions you have with your health care provider. Document Revised: 12/17/2022 Document Reviewed: 12/13/2021 Elsevier Patient Education  2024 Elsevier Inc.How to Use an Incentive Spirometer An incentive spirometer is a tool that measures how well you are filling your lungs with each breath. Learning to take long, deep  breaths using this tool can help you keep your lungs clear and active. This may help to reverse or lessen your chance of developing breathing (pulmonary) problems, especially infection. You may be asked to use a spirometer: After a surgery. If you have a lung problem or a history of smoking. After a long period of time when you have been unable to move  or be active. If the spirometer includes an indicator to show the highest number that you have reached, your health care provider or respiratory therapist will help you set a goal. Keep a log of your progress as told by your health care provider. What are the risks? Breathing too quickly may cause dizziness or cause you to pass out. Take your time so you do not get dizzy or light-headed. If you are in pain, you may need to take pain medicine before doing incentive spirometry. It is harder to take a deep breath if you are having pain. How to use your incentive spirometer  Sit up on the edge of your bed or on a chair. Hold the incentive spirometer so that it is in an upright position. Before you use the spirometer, breathe out normally. Place the mouthpiece in your mouth. Make sure your lips are closed tightly around it. Breathe in slowly and as deeply as you can through your mouth, causing the piston or the ball to rise toward the top of the chamber. Hold your breath for 3-5 seconds, or for as long as possible. If the spirometer includes a coach indicator, use this to guide you in breathing. Slow down your breathing if the indicator goes above the marked areas. Remove the mouthpiece from your mouth and breathe out normally. The piston or ball will return to the bottom of the chamber. Rest for a few seconds, then repeat the steps 10 or more times. Take your time and take a few normal breaths between deep breaths so that you do not get dizzy or light-headed. Do this every 1-2 hours when you are awake. If the spirometer includes a goal marker to show the highest number you have reached (best effort), use this as a goal to work toward during each repetition. After each set of 10 deep breaths, cough a few times. This will help to make sure that your lungs are clear. If you have an incision on your chest or abdomen from surgery, place a pillow or a rolled-up towel firmly against the incision when you  cough. This can help to reduce pain while taking deep breaths and coughing. General tips When you are able to get out of bed: Walk around often. Continue to take deep breaths and cough in order to clear your lungs. Keep using the incentive spirometer until your health care provider says it is okay to stop using it. If you have been in the hospital, you may be told to keep using the spirometer at home. Contact a health care provider if: You are having difficulty using the spirometer. You have trouble using the spirometer as often as instructed. Your pain medicine is not giving enough relief for you to use the spirometer as told. You have a fever. Get help right away if: You develop shortness of breath. You develop a cough with bloody mucus from the lungs. You have fluid or blood coming from an incision site after you cough. Summary An incentive spirometer is a tool that can help you learn to take long, deep breaths to keep your lungs clear and active.  You may be asked to use a spirometer after a surgery, if you have a lung problem or a history of smoking, or if you have been inactive for a long period of time. Use your incentive spirometer as instructed every 1-2 hours while you are awake. If you have an incision on your chest or abdomen, place a pillow or a rolled-up towel firmly against your incision when you cough. This will help to reduce pain. Get help right away if you have shortness of breath, you cough up bloody mucus, or blood comes from your incision when you cough. This information is not intended to replace advice given to you by your health care provider. Make sure you discuss any questions you have with your health care provider. Document Revised: 04/11/2023 Document Reviewed: 04/11/2023 Elsevier Patient Education  2024 [employer].

## 2023-09-18 NOTE — Telephone Encounter (Signed)
 Can we please call and see if we can get patient in to see Dr. Dallas Schimke tomorrow.

## 2023-09-18 NOTE — Telephone Encounter (Signed)
 Dr. Dallas Schimke pt - spoke w/Marie, the pt's daughter, she is requesting a scooter if possible so her mom can rest one knee and be mobile.  She stated she was told crutches was a no due to not very stable.  She is also requesting a cushion for her bottom, she stated her bottom is terribly bruised from the fall.  She would like this sent to [facility].

## 2023-09-19 ENCOUNTER — Ambulatory Visit (INDEPENDENT_AMBULATORY_CARE_PROVIDER_SITE_OTHER): Admitting: Orthopedic Surgery

## 2023-09-19 ENCOUNTER — Encounter (HOSPITAL_COMMUNITY): Payer: Self-pay

## 2023-09-19 ENCOUNTER — Other Ambulatory Visit: Payer: Self-pay

## 2023-09-19 ENCOUNTER — Encounter (HOSPITAL_COMMUNITY)
Admission: RE | Admit: 2023-09-19 | Discharge: 2023-09-19 | Disposition: A | Discharge status: Home / Self Care | Source: Ambulatory Visit | Attending: Orthopedic Surgery | Admitting: Orthopedic Surgery

## 2023-09-19 DIAGNOSIS — Z01812 Encounter for preprocedural laboratory examination: Secondary | ICD-10-CM | POA: Diagnosis present

## 2023-09-19 DIAGNOSIS — S82842A Displaced bimalleolar fracture of left lower leg, initial encounter for closed fracture: Secondary | ICD-10-CM

## 2023-09-19 DIAGNOSIS — Z01818 Encounter for other preprocedural examination: Secondary | ICD-10-CM

## 2023-09-19 HISTORY — DX: Nausea with vomiting, unspecified: R11.2

## 2023-09-19 HISTORY — DX: Other specified postprocedural states: Z98.890

## 2023-09-19 LAB — BASIC METABOLIC PANEL WITH GFR
Anion gap: 8 (ref 5–15)
BUN: 10 mg/dL (ref 8–23)
CO2: 25 mmol/L (ref 22–32)
Calcium: 8.8 mg/dL — ABNORMAL LOW (ref 8.9–10.3)
Chloride: 105 mmol/L (ref 98–111)
Creatinine, Ser: 0.74 mg/dL (ref 0.44–1.00)
GFR, Estimated: >60 mL/min (ref >60)
Glucose, Bld: 140 mg/dL — ABNORMAL HIGH (ref 70–99)
Potassium: 3.3 mmol/L — ABNORMAL LOW (ref 3.5–5.1)
Sodium: 138 mmol/L (ref 135–145)

## 2023-09-19 LAB — CBC
HCT: 36.8 % (ref 36.0–46.0)
Hemoglobin: 11.7 g/dL — ABNORMAL LOW (ref 12.0–15.0)
MCH: 31.2 pg (ref 26.0–34.0)
MCHC: 31.8 g/dL (ref 30.0–36.0)
MCV: 98.1 fL (ref 80.0–100.0)
Platelets: 159 10*3/uL (ref 150–400)
RBC: 3.75 MIL/uL — ABNORMAL LOW (ref 3.87–5.11)
RDW: 12.2 % (ref 11.5–15.5)
WBC: 7 10*3/uL (ref 4.0–10.5)
nRBC: 0 % (ref 0.0–0.2)

## 2023-09-19 NOTE — Progress Notes (Signed)
 New Patient Visit  Assessment: Michelle Bradford is a 88 y.o. female with the following: 1. Closed bimalleolar fracture of left ankle, initial encounter  Plan: Michelle Bradford fell and sustained a left bimalleolar ankle fracture.  She has significant pain at this point.  Given the appearance on x-rays, I recommended operative fixation.  Will plan to proceed with surgery on 09/22/2023.  She is to remain in her current splint.  Nonweightbearing.  Plan for surgery will involve a fibulok intramedullary nail, with evaluation of the medial clear space widening.  This was discussed with the patient and her daughter.  Given the social issues, we will plan to monitor her overnight following surgery.  She will be admitted for overnight observation.  She may require home health physical therapy.  Risks and benefits of surgery, including, but not limited to infection, bleeding, persistent pain, damage to surrounding structures, need for further surgery, malunion, nonunion and more severe complications associated with anesthesia were discussed.  All questions have been answered and they have elected to proceed with surgery.    Follow-up: Return for After surgery; DOS 09/22/23.  Subjective:  Chief Complaint  Patient presents with   Fracture    L ankle DOI 09/16/23    History of Present Illness: Michelle Bradford is a 88 y.o. female who presents for evaluation of left ankle pain.  She initially presented to the urgent care at [facility] after injuring her left ankle.  She slipped and fell.  She twisted the ankle.  She immediate pain.  At the urgent care, radiographs demonstrated a fracture.  She lives in [place], and was subsequently referred to our clinic.  She presents to clinic with her daughter.  Since injuring her left ankle, she has been taking hydrocodone.   Review of Systems: No fevers or chills No numbness or tingling No chest pain No shortness of breath No bowel or bladder dysfunction No GI distress No  headaches   Medical History:  Past Medical History:  Diagnosis Date   Hyperlipidemia    Hypothyroidism    Osteoporosis    PONV (postoperative nausea and vomiting)     Past Surgical History:  Procedure Laterality Date   ABDOMINAL HYSTERECTOMY     CATARACT EXTRACTION W/PHACO Left 05/01/2015   Procedure: CATARACT EXTRACTION PHACO AND INTRAOCULAR LENS PLACEMENT LEFT EYE;  Surgeon: Gemma Payor, MD;  Location: [facility];  Service: Ophthalmology;  Laterality: Left;  CDE:7.51   CATARACT EXTRACTION W/PHACO Right 05/29/2015   Procedure: CATARACT EXTRACTION PHACO AND INTRAOCULAR LENS PLACEMENT RIGHT EYE;  CDE:  9.91;  Surgeon: Gemma Payor, MD;  Location: [facility];  Service: Ophthalmology;  Laterality: Right;   HEMORROIDECTOMY     LAPAROTOMY     removal of fibroids   OPEN REDUCTION INTERNAL FIXATION (ORIF) HAND Left    wrist; has plates and screws in it.   SHOULDER ARTHROSCOPY Right    had a frozen shoulder    No family history on file. Social History   Tobacco Use   Smoking status: Former    Current packs/day: 0.00    Average packs/day: 0.5 packs/day for 20.0 years (10.0 ttl pk-yrs)    Types: Cigarettes    Start date: 04/25/1992    Quit date: 04/25/2012    Years since quitting: 11.4   Smokeless tobacco: Never  Substance Use Topics   Alcohol use: No   Drug use: No    No Known Allergies  Current Meds  Medication Sig   HYDROcodone-acetaminophen (NORCO/VICODIN) 5-325 MG  tablet Take 1 tablet by mouth every 6 (six) hours as needed.   acetaminophen (TYLENOL) 500 MG tablet Take 1,000 mg by mouth every 6 (six) hours as needed for mild pain (pain score 1-3).   furosemide (LASIX) 40 MG tablet Take 40 mg by mouth in the morning.   levothyroxine (SYNTHROID, LEVOTHROID) 25 MCG tablet Take 25 mcg by mouth daily before breakfast.   ofloxacin (FLOXIN) 0.3 % OTIC solution Place 5 drops into both ears daily.   potassium chloride (KLOR-CON) 10 MEQ tablet Take 10 mEq by mouth in the morning.    Propylene Glycol (LUBRICANT EYE DROP) 0.6 % SOLN Place 1-2 drops into both eyes 3 (three) times daily as needed (dry/irritated eyes.).   simvastatin (ZOCOR) 40 MG tablet Take 40 mg by mouth at bedtime.    Objective: There were no vitals taken for this visit.  Physical Exam:  General: Elderly female., No acute distress., and Seated in a wheelchair. Gait: Unable to ambulate.  Evaluation of left ankle demonstrates a splint that is clean, dry and intact.  Exposed toes are warm and well-perfused.  She is able to wiggle her toes.  Sensation is intact to the exposed toes.  No skin breakdown around the periphery of the splint.  IMAGING: I personally reviewed images previously obtained from the ED  X-rays were obtained prior to today's clinic visit.  There is a distal fibula fracture, with lateral displacement.  There is some increased medial clear space widening, with a small avulsion fracture.   New Medications:  Meds ordered this encounter  Medications   HYDROcodone-acetaminophen (NORCO/VICODIN) 5-325 MG tablet    Sig: Take 1 tablet by mouth every 6 (six) hours as needed.    Dispense:  20 tablet    Refill:  0      Oliver Barre, MD  09/20/2023 6:48 PM

## 2023-09-19 NOTE — Anesthesia Preprocedure Evaluation (Addendum)
 Anesthesia Evaluation  Patient identified by MRN, date of birth, ID band Patient awake    Reviewed: Allergy & Precautions, H&P , NPO status , Patient's Chart, lab work & pertinent test results, reviewed documented beta blocker date and time   History of Anesthesia Complications (+) PONV and history of anesthetic complications  Airway Mallampati: II  TM Distance: >3 FB Neck ROM: full    Dental no notable dental hx.    Pulmonary former smoker   Pulmonary exam normal breath sounds clear to auscultation       Cardiovascular Exercise Tolerance: Poor negative cardio ROS Normal cardiovascular exam Rhythm:regular Rate:Normal     Neuro/Psych       Dementia negative neurological ROS     GI/Hepatic negative GI ROS, Neg liver ROS,,,  Endo/Other  Hypothyroidism    Renal/GU negative Renal ROS  negative genitourinary   Musculoskeletal osteoporosis   Abdominal   Peds  Hematology negative hematology ROS (+)   Anesthesia Other Findings   Reproductive/Obstetrics negative OB ROS                             Anesthesia Physical Anesthesia Plan  ASA: 3  Anesthesia Plan: General   Post-op Pain Management: Dilaudid IV   Induction: Intravenous  PONV Risk Score and Plan: Ondansetron and Dexamethasone  Airway Management Planned: LMA  Additional Equipment: None  Intra-op Plan:   Post-operative Plan: Extubation in OR  Informed Consent: I have reviewed the patients History and Physical, chart, labs and discussed the procedure including the risks, benefits and alternatives for the proposed anesthesia with the patient or authorized representative who has indicated his/her understanding and acceptance.     Dental Advisory Given  Plan Discussed with: CRNA and Surgeon  Anesthesia Plan Comments:         Anesthesia Quick Evaluation

## 2023-09-19 NOTE — H&P (View-Only) (Signed)
 New Patient Visit  Assessment: Michelle Bradford is a 88 y.o. female with the following: 1. Closed bimalleolar fracture of left ankle, initial encounter  Plan: Michelle Bradford fell and sustained a left bimalleolar ankle fracture.  She has significant pain at this point.  Given the appearance on x-rays, I recommended operative fixation.  Will plan to proceed with surgery on 09/22/2023.  She is to remain in her current splint.  Nonweightbearing.  Plan for surgery will involve a fibulok intramedullary nail, with evaluation of the medial clear space widening.  This was discussed with the patient and her daughter.  Given the social issues, we will plan to monitor her overnight following surgery.  She will be admitted for overnight observation.  She may require home health physical therapy.  Risks and benefits of surgery, including, but not limited to infection, bleeding, persistent pain, damage to surrounding structures, need for further surgery, malunion, nonunion and more severe complications associated with anesthesia were discussed.  All questions have been answered and they have elected to proceed with surgery.    Follow-up: Return for After surgery; DOS 09/22/23.  Subjective:  Chief Complaint  Patient presents with   Fracture    L ankle DOI 09/16/23    History of Present Illness: Michelle Bradford is a 88 y.o. female who presents for evaluation of left ankle pain.  She initially presented to the urgent care at [facility] after injuring her left ankle.  She slipped and fell.  She twisted the ankle.  She immediate pain.  At the urgent care, radiographs demonstrated a fracture.  She lives in [place], and was subsequently referred to our clinic.  She presents to clinic with her daughter.  Since injuring her left ankle, she has been taking hydrocodone.   Review of Systems: No fevers or chills No numbness or tingling No chest pain No shortness of breath No bowel or bladder dysfunction No GI distress No  headaches   Medical History:  Past Medical History:  Diagnosis Date   Hyperlipidemia    Hypothyroidism    Osteoporosis    PONV (postoperative nausea and vomiting)     Past Surgical History:  Procedure Laterality Date   ABDOMINAL HYSTERECTOMY     CATARACT EXTRACTION W/PHACO Left 05/01/2015   Procedure: CATARACT EXTRACTION PHACO AND INTRAOCULAR LENS PLACEMENT LEFT EYE;  Surgeon: Gemma Payor, MD;  Location: [facility];  Service: Ophthalmology;  Laterality: Left;  CDE:7.51   CATARACT EXTRACTION W/PHACO Right 05/29/2015   Procedure: CATARACT EXTRACTION PHACO AND INTRAOCULAR LENS PLACEMENT RIGHT EYE;  CDE:  9.91;  Surgeon: Gemma Payor, MD;  Location: [facility];  Service: Ophthalmology;  Laterality: Right;   HEMORROIDECTOMY     LAPAROTOMY     removal of fibroids   OPEN REDUCTION INTERNAL FIXATION (ORIF) HAND Left    wrist; has plates and screws in it.   SHOULDER ARTHROSCOPY Right    had a frozen shoulder    No family history on file. Social History   Tobacco Use   Smoking status: Former    Current packs/day: 0.00    Average packs/day: 0.5 packs/day for 20.0 years (10.0 ttl pk-yrs)    Types: Cigarettes    Start date: 04/25/1992    Quit date: 04/25/2012    Years since quitting: 11.4   Smokeless tobacco: Never  Substance Use Topics   Alcohol use: No   Drug use: No    No Known Allergies  Current Meds  Medication Sig   HYDROcodone-acetaminophen (NORCO/VICODIN) 5-325 MG  tablet Take 1 tablet by mouth every 6 (six) hours as needed.   acetaminophen (TYLENOL) 500 MG tablet Take 1,000 mg by mouth every 6 (six) hours as needed for mild pain (pain score 1-3).   furosemide (LASIX) 40 MG tablet Take 40 mg by mouth in the morning.   levothyroxine (SYNTHROID, LEVOTHROID) 25 MCG tablet Take 25 mcg by mouth daily before breakfast.   ofloxacin (FLOXIN) 0.3 % OTIC solution Place 5 drops into both ears daily.   potassium chloride (KLOR-CON) 10 MEQ tablet Take 10 mEq by mouth in the morning.    Propylene Glycol (LUBRICANT EYE DROP) 0.6 % SOLN Place 1-2 drops into both eyes 3 (three) times daily as needed (dry/irritated eyes.).   simvastatin (ZOCOR) 40 MG tablet Take 40 mg by mouth at bedtime.    Objective: There were no vitals taken for this visit.  Physical Exam:  General: Elderly female., No acute distress., and Seated in a wheelchair. Gait: Unable to ambulate.  Evaluation of left ankle demonstrates a splint that is clean, dry and intact.  Exposed toes are warm and well-perfused.  She is able to wiggle her toes.  Sensation is intact to the exposed toes.  No skin breakdown around the periphery of the splint.  IMAGING: I personally reviewed images previously obtained from the ED  X-rays were obtained prior to today's clinic visit.  There is a distal fibula fracture, with lateral displacement.  There is some increased medial clear space widening, with a small avulsion fracture.   New Medications:  Meds ordered this encounter  Medications   HYDROcodone-acetaminophen (NORCO/VICODIN) 5-325 MG tablet    Sig: Take 1 tablet by mouth every 6 (six) hours as needed.    Dispense:  20 tablet    Refill:  0      Oliver Barre, MD  09/20/2023 6:48 PM

## 2023-09-20 ENCOUNTER — Encounter: Payer: Self-pay | Admitting: Orthopedic Surgery

## 2023-09-20 MED ORDER — HYDROCODONE-ACETAMINOPHEN 5-325 MG PO TABS: 1.0000 | ORAL_TABLET | Freq: Four times a day (QID) | ORAL | 0 refills | Status: DC | PRN

## 2023-09-22 ENCOUNTER — Encounter (HOSPITAL_COMMUNITY)
Admission: RE | Disposition: A | Discharge status: Skilled Nursing Facility | Payer: Self-pay | Source: Home / Self Care | Attending: Orthopedic Surgery

## 2023-09-22 ENCOUNTER — Encounter (HOSPITAL_COMMUNITY): Payer: Self-pay | Admitting: Orthopedic Surgery

## 2023-09-22 ENCOUNTER — Observation Stay (HOSPITAL_COMMUNITY)
Admission: RE | Admit: 2023-09-22 | Discharge: 2023-09-24 | Disposition: A | Discharge status: Skilled Nursing Facility | Attending: Orthopedic Surgery | Admitting: Orthopedic Surgery

## 2023-09-22 ENCOUNTER — Ambulatory Visit (HOSPITAL_COMMUNITY)

## 2023-09-22 ENCOUNTER — Ambulatory Visit (HOSPITAL_COMMUNITY): Payer: Self-pay | Admitting: Anesthesiology

## 2023-09-22 ENCOUNTER — Ambulatory Visit (HOSPITAL_BASED_OUTPATIENT_CLINIC_OR_DEPARTMENT_OTHER): Payer: Self-pay | Admitting: Anesthesiology

## 2023-09-22 DIAGNOSIS — R2689 Other abnormalities of gait and mobility: Secondary | ICD-10-CM | POA: Diagnosis not present

## 2023-09-22 DIAGNOSIS — E039 Hypothyroidism, unspecified: Secondary | ICD-10-CM

## 2023-09-22 DIAGNOSIS — S82842A Displaced bimalleolar fracture of left lower leg, initial encounter for closed fracture: Secondary | ICD-10-CM | POA: Diagnosis present

## 2023-09-22 DIAGNOSIS — R2681 Unsteadiness on feet: Secondary | ICD-10-CM | POA: Diagnosis not present

## 2023-09-22 DIAGNOSIS — X58XXXA Exposure to other specified factors, initial encounter: Secondary | ICD-10-CM | POA: Diagnosis not present

## 2023-09-22 DIAGNOSIS — Z9181 History of falling: Secondary | ICD-10-CM | POA: Diagnosis not present

## 2023-09-22 DIAGNOSIS — Z87891 Personal history of nicotine dependence: Secondary | ICD-10-CM | POA: Insufficient documentation

## 2023-09-22 DIAGNOSIS — Z79899 Other long term (current) drug therapy: Secondary | ICD-10-CM | POA: Insufficient documentation

## 2023-09-22 DIAGNOSIS — M6281 Muscle weakness (generalized): Secondary | ICD-10-CM | POA: Diagnosis not present

## 2023-09-22 HISTORY — PX: ORIF ANKLE FRACTURE: SHX5408

## 2023-09-22 SURGERY — OPEN REDUCTION INTERNAL FIXATION (ORIF) ANKLE FRACTURE
Anesthesia: General | Site: Ankle | Laterality: Left

## 2023-09-22 MED ORDER — ACETAMINOPHEN 325 MG PO TABS
325.0000 mg | ORAL_TABLET | Freq: Four times a day (QID) | ORAL | Status: DC | PRN
  Administered 2023-09-23 (×2): 650 mg via ORAL
  Filled 2023-09-22 (×2): qty 2

## 2023-09-22 MED ORDER — PHENYLEPHRINE HCL-NACL 20-0.9 MG/250ML-% IV SOLN
INTRAVENOUS | Status: AC
Start: 2023-09-22 — End: ?
  Filled 2023-09-22: qty 250

## 2023-09-22 MED ORDER — BUPIVACAINE-EPINEPHRINE (PF) 0.5% -1:200000 IJ SOLN
INTRAMUSCULAR | Status: DC | PRN
  Administered 2023-09-22: 30 mL via PERINEURAL

## 2023-09-22 MED ORDER — SIMVASTATIN 20 MG PO TABS
40.0000 mg | ORAL_TABLET | Freq: Every day | ORAL | Status: DC
  Administered 2023-09-22 – 2023-09-23 (×2): 40 mg via ORAL
  Filled 2023-09-22 (×2): qty 2

## 2023-09-22 MED ORDER — PROPOFOL 10 MG/ML IV BOLUS
INTRAVENOUS | Status: DC | PRN
  Administered 2023-09-22: 70 mg via INTRAVENOUS
  Administered 2023-09-22: 30 mg via INTRAVENOUS

## 2023-09-22 MED ORDER — PROMETHAZINE (PHENERGAN) 6.25MG IN NS 50ML IVPB: 6.2500 mg | INTRAVENOUS | Status: DC | PRN

## 2023-09-22 MED ORDER — ONDANSETRON HCL 4 MG/2ML IJ SOLN
4.0000 mg | Freq: Four times a day (QID) | INTRAMUSCULAR | Status: DC | PRN
Start: 2023-09-22 — End: 2023-09-24

## 2023-09-22 MED ORDER — ORAL CARE MOUTH RINSE: 15.0000 mL | Freq: Once | OROMUCOSAL | Status: AC

## 2023-09-22 MED ORDER — ONDANSETRON HCL 4 MG/2ML IJ SOLN
INTRAMUSCULAR | Status: AC
  Filled 2023-09-22: qty 4

## 2023-09-22 MED ORDER — HYDROMORPHONE HCL 1 MG/ML IJ SOLN: 0.2500 mg | INTRAMUSCULAR | Status: DC | PRN

## 2023-09-22 MED ORDER — LACTATED RINGERS IV SOLN: INTRAVENOUS | Status: DC

## 2023-09-22 MED ORDER — HYDROCODONE-ACETAMINOPHEN 7.5-325 MG PO TABS: 1.0000 | ORAL_TABLET | Freq: Once | ORAL | Status: DC | PRN

## 2023-09-22 MED ORDER — 0.9 % SODIUM CHLORIDE (POUR BTL) OPTIME
TOPICAL | Status: DC | PRN
  Administered 2023-09-22: 1000 mL

## 2023-09-22 MED ORDER — ACETAMINOPHEN 160 MG/5ML PO SOLN
960.0000 mg | Freq: Once | ORAL | Status: AC
  Filled 2023-09-22: qty 30

## 2023-09-22 MED ORDER — ACETAMINOPHEN 500 MG PO TABS
ORAL_TABLET | ORAL | Status: AC
  Administered 2023-09-22: 1000 mg via ORAL
  Filled 2023-09-22: qty 2

## 2023-09-22 MED ORDER — DIPHENHYDRAMINE HCL 12.5 MG/5ML PO ELIX: 12.5000 mg | ORAL_SOLUTION | ORAL | Status: DC | PRN

## 2023-09-22 MED ORDER — CEFAZOLIN SODIUM-DEXTROSE 2-4 GM/100ML-% IV SOLN
INTRAVENOUS | Status: AC
  Filled 2023-09-22: qty 100

## 2023-09-22 MED ORDER — OFLOXACIN 0.3 % OP SOLN
5.0000 [drp] | Freq: Every day | OPHTHALMIC | Status: DC
  Administered 2023-09-22 – 2023-09-24 (×3): 5 [drp] via OTIC
  Filled 2023-09-22: qty 5

## 2023-09-22 MED ORDER — FUROSEMIDE 40 MG PO TABS
40.0000 mg | ORAL_TABLET | Freq: Every morning | ORAL | Status: DC
  Administered 2023-09-23 – 2023-09-24 (×2): 40 mg via ORAL
  Filled 2023-09-22 (×2): qty 1

## 2023-09-22 MED ORDER — CHLORHEXIDINE GLUCONATE 0.12 % MT SOLN
15.0000 mL | Freq: Once | OROMUCOSAL | Status: AC
  Administered 2023-09-22: 15 mL via OROMUCOSAL

## 2023-09-22 MED ORDER — MORPHINE SULFATE (PF) 2 MG/ML IV SOLN
0.5000 mg | INTRAVENOUS | Status: DC | PRN
  Administered 2023-09-22 – 2023-09-23 (×3): 1 mg via INTRAVENOUS
  Filled 2023-09-22 (×3): qty 1

## 2023-09-22 MED ORDER — ONDANSETRON HCL 4 MG PO TABS: 4.0000 mg | ORAL_TABLET | Freq: Four times a day (QID) | ORAL | Status: DC | PRN

## 2023-09-22 MED ORDER — DEXMEDETOMIDINE HCL IN NACL 80 MCG/20ML IV SOLN
INTRAVENOUS | Status: DC | PRN
  Administered 2023-09-22 (×2): 8 ug via INTRAVENOUS

## 2023-09-22 MED ORDER — LEVOTHYROXINE SODIUM 25 MCG PO TABS
25.0000 ug | ORAL_TABLET | Freq: Every day | ORAL | Status: DC
  Administered 2023-09-23 – 2023-09-24 (×2): 25 ug via ORAL
  Filled 2023-09-22 (×2): qty 1

## 2023-09-22 MED ORDER — ACETAMINOPHEN 500 MG PO TABS: 1000.0000 mg | ORAL_TABLET | Freq: Once | ORAL | Status: AC

## 2023-09-22 MED ORDER — BUPIVACAINE-EPINEPHRINE (PF) 0.5% -1:200000 IJ SOLN
INTRAMUSCULAR | Status: AC
  Filled 2023-09-22: qty 30

## 2023-09-22 MED ORDER — DEXMEDETOMIDINE HCL IN NACL 80 MCG/20ML IV SOLN
INTRAVENOUS | Status: AC
  Filled 2023-09-22: qty 20

## 2023-09-22 MED ORDER — ARTIFICIAL TEARS OPHTHALMIC OINT
TOPICAL_OINTMENT | OPHTHALMIC | Status: AC
  Filled 2023-09-22: qty 3.5

## 2023-09-22 MED ORDER — DEXAMETHASONE SODIUM PHOSPHATE 10 MG/ML IJ SOLN
INTRAMUSCULAR | Status: AC
  Filled 2023-09-22: qty 1

## 2023-09-22 MED ORDER — FENTANYL CITRATE (PF) 250 MCG/5ML IJ SOLN
INTRAMUSCULAR | Status: DC | PRN
  Administered 2023-09-22 (×2): 50 ug via INTRAVENOUS
  Administered 2023-09-22 (×4): 25 ug via INTRAVENOUS
  Administered 2023-09-22: 50 ug via INTRAVENOUS

## 2023-09-22 MED ORDER — CEFAZOLIN SODIUM-DEXTROSE 2-4 GM/100ML-% IV SOLN
2.0000 g | INTRAVENOUS | Status: AC
  Administered 2023-09-22: 2 g via INTRAVENOUS

## 2023-09-22 MED ORDER — FENTANYL CITRATE (PF) 250 MCG/5ML IJ SOLN
INTRAMUSCULAR | Status: AC
  Filled 2023-09-22: qty 5

## 2023-09-22 MED ORDER — EPHEDRINE 5 MG/ML INJ
INTRAVENOUS | Status: AC
  Filled 2023-09-22: qty 5

## 2023-09-22 MED ORDER — LIDOCAINE HCL (CARDIAC) PF 100 MG/5ML IV SOSY
PREFILLED_SYRINGE | INTRAVENOUS | Status: DC | PRN
  Administered 2023-09-22: 60 mg via INTRAVENOUS

## 2023-09-22 MED ORDER — EPHEDRINE SULFATE-NACL 50-0.9 MG/10ML-% IV SOSY
PREFILLED_SYRINGE | INTRAVENOUS | Status: DC | PRN
  Administered 2023-09-22 (×2): 5 mg via INTRAVENOUS

## 2023-09-22 MED ORDER — ONDANSETRON HCL 4 MG/2ML IJ SOLN
INTRAMUSCULAR | Status: DC | PRN
  Administered 2023-09-22: 4 mg via INTRAVENOUS

## 2023-09-22 MED ORDER — HYDROCODONE-ACETAMINOPHEN 5-325 MG PO TABS
1.0000 | ORAL_TABLET | ORAL | Status: DC | PRN
  Administered 2023-09-22 – 2023-09-23 (×2): 1 via ORAL
  Administered 2023-09-23 – 2023-09-24 (×3): 2 via ORAL
  Filled 2023-09-22 (×2): qty 1
  Filled 2023-09-22: qty 2
  Filled 2023-09-22: qty 1
  Filled 2023-09-22 (×2): qty 2

## 2023-09-22 MED ORDER — PROPOFOL 10 MG/ML IV BOLUS
INTRAVENOUS | Status: AC
  Filled 2023-09-22: qty 20

## 2023-09-22 MED ORDER — DEXAMETHASONE SODIUM PHOSPHATE 10 MG/ML IJ SOLN
INTRAMUSCULAR | Status: DC | PRN
  Administered 2023-09-22: 5 mg via INTRAVENOUS

## 2023-09-22 SURGICAL SUPPLY — 46 items
BANDAGE ESMARK 4X12 BL STRL LF (DISPOSABLE) ×1 IMPLANT
BIT DRILL 3.7 (BIT) IMPLANT
BLADE SURG 15 STRL LF DISP TIS (BLADE) ×1 IMPLANT
BLADE SURG SZ10 CARB STEEL (BLADE) IMPLANT
BNDG ELASTIC 4X5.8 VLCR NS LF (GAUZE/BANDAGES/DRESSINGS) ×1 IMPLANT
BNDG ESMARK 4X12 BLUE STRL LF (DISPOSABLE) ×1 IMPLANT
CHLORAPREP W/TINT 26 (MISCELLANEOUS) ×1 IMPLANT
CLOTH BEACON ORANGE TIMEOUT ST (SAFETY) ×1 IMPLANT
COVER LIGHT HANDLE STERIS (MISCELLANEOUS) ×2 IMPLANT
CUFF TRNQT CYL 34X4.125X (TOURNIQUET CUFF) ×1 IMPLANT
DRAPE C-ARM FOLDED MOBILE STRL (DRAPES) ×1 IMPLANT
DRAPE C-ARMOR (DRAPES) ×1 IMPLANT
ELECT REM PT RETURN 9FT ADLT (ELECTROSURGICAL) ×1 IMPLANT
ELECTRODE REM PT RTRN 9FT ADLT (ELECTROSURGICAL) ×1 IMPLANT
FIBULOCK IMPLANT SYSTEM STER (Miscellaneous) ×1 IMPLANT
GAUZE SPONGE 4X4 12PLY STRL (GAUZE/BANDAGES/DRESSINGS) ×1 IMPLANT
GAUZE XEROFORM 1X8 LF (GAUZE/BANDAGES/DRESSINGS) ×1 IMPLANT
GLOVE BIO SURGEON STRL SZ8 (GLOVE) ×2 IMPLANT
GLOVE BIOGEL PI IND STRL 7.0 (GLOVE) ×2 IMPLANT
GLOVE BIOGEL PI IND STRL 8 (GLOVE) ×1 IMPLANT
GOWN STRL REUS W/TWL LRG LVL3 (GOWN DISPOSABLE) ×1 IMPLANT
GOWN STRL REUS W/TWL XL LVL3 (GOWN DISPOSABLE) ×3 IMPLANT
KIT INST FIBULOCK NL STL DISP (Miscellaneous) IMPLANT
KIT TURNOVER KIT A (KITS) ×1 IMPLANT
MANIFOLD NEPTUNE II (INSTRUMENTS) ×1 IMPLANT
NAIL FIBULOCK 3.0X130 LEFT (Nail) IMPLANT
NDL HYPO 21X1.5 SAFETY (NEEDLE) IMPLANT
NEEDLE HYPO 21X1.5 SAFETY (NEEDLE) ×1 IMPLANT
NS IRRIG 1000ML POUR BTL (IV SOLUTION) ×1 IMPLANT
PACK BASIC LIMB (CUSTOM PROCEDURE TRAY) ×1 IMPLANT
PAD ABD 5X9 TENDERSORB (GAUZE/BANDAGES/DRESSINGS) ×4 IMPLANT
PAD ARMBOARD POSITIONER FOAM (MISCELLANEOUS) ×1 IMPLANT
PAD CAST 4YDX4 CTTN HI CHSV (CAST SUPPLIES) ×1 IMPLANT
PADDING CAST ABS COTTON 4X4 ST (CAST SUPPLIES) ×2 IMPLANT
POSITIONER HEAD 8X9X4 ADT (SOFTGOODS) ×1 IMPLANT
SCREW CAN THREAD 3X18 (Screw) IMPLANT
SET BASIN LINEN APH (SET/KITS/TRAYS/PACK) ×1 IMPLANT
SPLINT PLASTER CAST XFAST 5X30 (CAST SUPPLIES) IMPLANT
SPONGE T-LAP 18X18 ~~LOC~~+RFID (SPONGE) ×1 IMPLANT
STRIP CLOSURE SKIN 1/2X4 (GAUZE/BANDAGES/DRESSINGS) IMPLANT
SUT 3-0 BLK 1X30 PSL (SUTURE) ×1 IMPLANT
SUT MON AB 2-0 CT1 36 (SUTURE) IMPLANT
SUT VIC AB 2-0 CT1 TAPERPNT 27 (SUTURE) ×1 IMPLANT
SYR 30ML LL (SYRINGE) IMPLANT
SYR BULB IRRIG 60ML STRL (SYRINGE) ×1 IMPLANT
SYSTEM IMPLANT FIBULOCK STRL (Miscellaneous) ×1 IMPLANT

## 2023-09-22 NOTE — Care Management Obs Status (Signed)
 MEDICARE OBSERVATION STATUS NOTIFICATION   Patient Details  Name: SHAUNIKA ITALIANO MRN: [id number] Date of Birth: 08/29/1935   Medicare Observation Status Notification Given:  Yes    Barron Alvine, RN 09/22/2023, 8:29 PM

## 2023-09-22 NOTE — Progress Notes (Signed)
 Patient able to wiggle toes

## 2023-09-22 NOTE — Anesthesia Postprocedure Evaluation (Signed)
 Anesthesia Post Note  Patient: Michelle Bradford  Procedure(s) Performed: OPEN REDUCTION INTERNAL FIXATION (ORIF) LEFT DISTAL TIBIA FRACTURE (Left: Ankle)  Patient location during evaluation: PACU Anesthesia Type: General Level of consciousness: awake and alert Pain management: pain level controlled Vital Signs Assessment: post-procedure vital signs reviewed and stable Respiratory status: spontaneous breathing, nonlabored ventilation, respiratory function stable and patient connected to nasal cannula oxygen Cardiovascular status: blood pressure returned to baseline and stable Postop Assessment: no apparent nausea or vomiting Anesthetic complications: no   There were no known notable events for this encounter.   Last Vitals:  Vitals:   09/22/23 0930 09/22/23 0951  BP: (!) 130/47 (!) 130/44  Pulse: 70 70  Resp: 17 18  Temp: 36.7 C 36.6 C  SpO2: 98% 98%    Last Pain:  Vitals:   09/22/23 1014  TempSrc:   PainSc: 10-Worst pain ever                 Gaetano Hawthorne

## 2023-09-22 NOTE — Interval H&P Note (Signed)
 History and Physical Interval Note:  09/22/2023 7:07 AM  Michelle Bradford  has presented today for surgery, with the diagnosis of Left bimalleolar ankle fracture.  The various methods of treatment have been discussed with the patient and family. After consideration of risks, benefits and other options for treatment, the patient has consented to  Procedure(s): OPEN REDUCTION INTERNAL FIXATION (ORIF) ANKLE FRACTURE (Left) as a surgical intervention.  The patient's history has been reviewed, patient examined, no change in status, stable for surgery.  I have reviewed the patient's chart and labs.  Questions were answered to the patient's satisfaction.     Oliver Barre

## 2023-09-22 NOTE — Anesthesia Procedure Notes (Signed)
 Procedure Name: LMA Insertion Date/Time: 09/22/2023 7:35 AM  Performed by: Lorin Glass, CRNAPre-anesthesia Checklist: Emergency Drugs available, Patient identified, Suction available and Patient being monitored Patient Re-evaluated:Patient Re-evaluated prior to induction Oxygen Delivery Method: Circle system utilized Preoxygenation: Pre-oxygenation with 100% oxygen Induction Type: IV induction LMA: LMA with gastric port inserted LMA Size: 4.0 Number of attempts: 1 Placement Confirmation: positive ETCO2 and breath sounds checked- equal and bilateral Tube secured with: Tape Dental Injury: Teeth and Oropharynx as per pre-operative assessment

## 2023-09-22 NOTE — Op Note (Signed)
 Orthopaedic Surgery Operative Note (CSN: [id number])  Michelle Bradford  24-Mar-1936 Date of Surgery: 09/22/2023   Diagnoses:  Left bimalleolar ankle fracture  Procedure: Operative fixation of left bimalleolar ankle fracture, with insertion of an intramedullary nail of the fibula.   Operative Finding Successful completion of the planned procedure.  Small avulsion fracture of the medial aspect of the ankle, which remained stable.  The distal fibula fracture was secured with fibula intramedullary nail.   Post-Op Diagnosis: Same Surgeons:Primary: Oliver Barre, MD Assistants: Westly Pam Location: [facility] OR ROOM 4 Anesthesia: General with local anesthesia Antibiotics: Ancef 2 g Tourniquet time:  Total Tourniquet Time Documented: Thigh (Left) - 36 minutes Total: Thigh (Left) - 36 minutes  Estimated Blood Loss: 10 cc Complications: None Specimens: None  Implants: Implant Name Type Inv. Item Serial No. Manufacturer Lot No. LRB No. Used Action  FIBULOCK IMPLANT SYSTEM STER - [id number] Miscellaneous FIBULOCK IMPLANT SYSTEM STER  [employer] [id number] Left 1 Implanted  NAIL FIBULOCK 3.0X130 LEFT - [id number] Nail NAIL FIBULOCK 3.0X130 LEFT  [employer] [id number] Left 1 Implanted  SCREW CAN THREAD 3X18 - [id number] Screw SCREW CAN THREAD 3X18  [employer] STERILE ON SET Left 2 Implanted    Indications for Surgery:   Michelle Bradford is a 88 y.o. female who fell and sustained a left ankle fracture.  This included a distal fibula fracture, as well as small avulsion fracture of the medial malleolus.  Overall, represented an unstable injury.  Due to the nature of the injury, I recommended operative fixation. Benefits and risks of operative and nonoperative management were discussed prior to surgery with the patient and her daughter. Informed consent form was completed.  Specific risks including infection, need for additional surgery, bleeding, stiffness, persistent pain, malunion, nonunion, blood clots and  more severe complications associated with anesthesia were discussed.  All questions have been answered.  She elected to proceed.   Procedure:   The patient was identified properly. Informed consent was obtained and the surgical site was marked. The patient was taken to the OR where general anesthesia was induced.  The patient was positioned supine, with the leg on bone foam.  The left leg was prepped and draped in the usual sterile fashion.  Timeout was performed before the beginning of the case.  Tourniquet was used for the above duration.  The ankle was critically evaluated under fluoroscopy.  There was a distal fibula fracture, with mild medial clear space widening, as well as a small distal avulsion fracture of the medial malleolus.  We plan to proceed with the fibula intramedullary nail.  We made an incision extending over the distal fibula, approximately 1 cm distal to the tip of the fibula.  We dissected down to the bone, to expose the tip of the fibula.  We were able to maintain reduction without placing a clamp.  Under direct fluoroscopic guidance, a K wire was introduced, serving as our guidewire.  This was confirmed to be in good orientation on both the AP and lateral views.  Once were satisfied with the overall alignment, as well as reduction of the fracture, an opening reamer was used.  We then used a reamer to extend in the proximal portion of the fibular fracture.  The above selected fibula nail was then inserted under direct visualization.  We confirmed that the fracture was secured, as well as appropriately reduced.  We then confirmed that the tip of the nail was within bone.  We then deployed the  proximal talons.  Finally, we proceeded to place 2 lateral to medial cancellous screws through the nail to secure the nail distally.  The nail was not prominent at the distal fibula.  The ankle was critically evaluated in the fluoroscopy.  The distal fibula fracture was reduced.  There was no  medial clear space widening.  The mortise was congruent.  Therefore, nothing further was needed.  We irrigated the wound copiously.  We closed the incision in a multilayer fashion with absorbable suture.  Sterile dressing was placed, followed by well-padded splint.  Patient was awoken taken to PACU in stable condition.   Post-operative plan:  The patient will be NWB on the operative extremity She will be admitted for overnight observation DVT prophylaxis Aspirin 81 mg twice daily for 6 weeks.    Pain control with PRN pain medication preferring oral medicines.   Follow up plan will be scheduled in approximately 10-14 days for incision check and XR.

## 2023-09-22 NOTE — Transfer of Care (Signed)
 Immediate Anesthesia Transfer of Care Note  Patient: Michelle Bradford  Procedure(s) Performed: OPEN REDUCTION INTERNAL FIXATION (ORIF) LEFT DISTAL TIBIA FRACTURE (Left: Ankle)  Patient Location: PACU  Anesthesia Type:General  Level of Consciousness: awake  Airway & Oxygen Therapy: Patient Spontanous Breathing and Patient connected to nasal cannula oxygen  Post-op Assessment: Report given to RN and Post -op Vital signs reviewed and stable  Post vital signs: Reviewed and stable  Last Vitals:  Vitals Value Taken Time  BP 127/41 09/22/23 0855  Temp 99.2   Pulse 71 09/22/23 0855  Resp 17 09/22/23 0855  SpO2 95 % 09/22/23 0855  Vitals shown include unfiled device data.  Last Pain:  Vitals:   09/22/23 0707  TempSrc:   PainSc: 0-No pain         Complications: No notable events documented.

## 2023-09-22 NOTE — TOC CM/SW Note (Addendum)
 Transition of Care [facility]) - Inpatient Brief Assessment   Patient Details  Name: Michelle Bradford MRN: [id number] Date of Birth: 16-Aug-1935  Transition of Care [facility]) CM/SW Contact:    Isabella Bowens, LCSWA Phone Number: 09/22/2023, 2:30 PM   Clinical Narrative:   Transition of Care Department [facility]) has reviewed patient and no TOC needs have been identified at this time. We will continue to monitor patient advancement through interdisciplinary progression rounds. If new patient transition needs arise, please place a TOC consult.  CSW spoke with daughter whose at bedside. Daughter shared that pt has a roommate and that her roommate is unable to do for herself, so cannot help patient. Daughter states that pt will need someone to come out to her home to help with a bathe and meals. Also shared that she recently had a handicap ramp put up for patient and purchased her a knee scooter.   Daughter did mention that pt would benefit from a power lift recliner chair that she could sleep in. CSW mentioned to daughter about getting with the doctor and ask about if this could be ordered.  CSW did provide daughter with [employer] to see if she could find one there if we could not arrange for patient to have one through her insurance.   Doctor Dallas Schimke was notified about the matters above and if office can assist. TOC to follow.   Transition of Care Asessment: Insurance and Status: Insurance coverage has been reviewed Patient has primary care physician: Yes Home environment has been reviewed: Single Wide trailer Prior level of function:: Needs assistance, was independent Prior/Current Home Services: No current home services Social Drivers of Health Review: SDOH reviewed no interventions necessary Readmission risk has been reviewed: Yes Transition of care needs: transition of care needs identified, TOC will continue to follow

## 2023-09-23 DIAGNOSIS — S82842A Displaced bimalleolar fracture of left lower leg, initial encounter for closed fracture: Secondary | ICD-10-CM | POA: Diagnosis not present

## 2023-09-23 MED ORDER — HYDROCODONE-ACETAMINOPHEN 5-325 MG PO TABS: 1.0000 | ORAL_TABLET | Freq: Four times a day (QID) | ORAL | 0 refills | Status: AC | PRN

## 2023-09-23 MED ORDER — ASPIRIN 81 MG PO TBEC: 81.0000 mg | DELAYED_RELEASE_TABLET | Freq: Two times a day (BID) | ORAL | 0 refills | Status: AC

## 2023-09-23 MED ORDER — ONDANSETRON HCL 4 MG PO TABS: 4.0000 mg | ORAL_TABLET | Freq: Three times a day (TID) | ORAL | 0 refills | Status: AC | PRN

## 2023-09-23 NOTE — Discharge Instructions (Signed)
 Maleeka Sabatino A. Dallas Schimke, MD MS [facility] [address] [place],  [place]  [address] Phone: [phone] Fax: [phone]   POST-OPERATIVE INSTRUCTIONS - LOWER EXTREMITY   WOUND CARE Please keep splint clean dry and intact until followup.  You may shower on Post-Op Day #2.  You must keep splint dry during this process and may find that a plastic bag taped around the leg or alternatively a towel based bath may be a better option.   If you get your splint wet or if it is damaged please contact our clinic.  EXERCISES Due to your splint being in place you will not be able to bear weight through your extremity.   DO NOT PUT ANY WEIGHT ON YOUR OPERATIVE LEG Please use crutches or a walker to avoid weight bearing.   REGIONAL ANESTHESIA (NERVE BLOCKS) The anesthesia team may have performed a nerve block for you if safe in the setting of your care.  This is a great tool used to minimize pain.  Typically the block may start wearing off overnight but the long acting medicine may last for 3-4 days.  The nerve block wearing off can be a challenging period but please utilize your as needed pain medications to try and manage this period.    POST-OP MEDICATIONS- Multimodal approach to pain control  In general your pain will be controlled with a combination of substances.  Prescriptions unless otherwise discussed are electronically sent to your pharmacy.  This is a carefully made plan we use to minimize narcotic use.     - Hydrocodone - This is a strong narcotic, to be used only on an "as needed" basis for pain.  -  Aspirin 81mg  - This medicine is used to minimize the risk of blood clots after surgery.             -          Zofran - take as needed for nausea   FOLLOW-UP If you develop a Fever (>101.5), Redness or Drainage from the surgical incision site, please call our office to arrange for an evaluation. Please call the office to schedule a follow-up appointment for your  incision check if you do not already have one, 10-14 days post-operatively.  IF YOU HAVE ANY QUESTIONS, PLEASE FEEL FREE TO CALL OUR OFFICE.  HELPFUL INFORMATION  If you had a block, it will wear off between 8-24 hrs postop typically.  This is period when your pain may go from nearly zero to the pain you would have had postop without the block.  This is an abrupt transition but nothing dangerous is happening.  You may take an extra dose of narcotic when this happens.  You should wean off your narcotic medicines as soon as you are able.  Most patients will be off or using minimal narcotics before their first postop appointment.   Elevating your leg will help with swelling and pain control.  You are encouraged to elevate your leg as much as possible in the first couple of weeks following surgery.  Imagine a drop of water on your toe, and your goal is to get that water back to your heart.  We suggest you use the pain medication the first night prior to going to bed, in order to ease any pain when the anesthesia wears off. You should avoid taking pain medications on an empty stomach as it will make you nauseous.  Do not drink alcoholic beverages or take illicit drugs  when taking pain medications.  In most states it is against the law to drive while you are in a splint or sling.  And certainly against the law to drive while taking narcotics.  You may return to work/school in the next couple of days when you feel up to it.   Pain medication may make you constipated.  Below are a few solutions to try in this order: Decrease the amount of pain medication if you aren't having pain. Drink lots of decaffeinated fluids. Drink prune juice and/or each dried prunes  If the first 3 don't work start with additional solutions Take Colace - an over-the-counter stool softener Take Senokot - an over-the-counter laxative Take Miralax - a stronger over-the-counter laxative

## 2023-09-23 NOTE — Discharge Summary (Incomplete Revision)
 Patient ID: Michelle Bradford MRN: [id number] DOB/AGE: 1936/02/07 88 y.o.  Admit date: 09/22/2023 Discharge date: 09/24/2023  Admission Diagnoses: Left bimalleolar ankle fracture  Discharge Diagnoses:  Principal Problem:   Bimalleolar ankle fracture, left, closed, initial encounter   Past Medical History:  Diagnosis Date   Hyperlipidemia    Hypothyroidism    Osteoporosis    PONV (postoperative nausea and vomiting)      Procedures Performed: ORIF of left ankle fracture  Discharged Condition: good  Hospital Course: Patient brought in as an outpatient for surgery.  Tolerated procedure well.  Was admitted for pain control and medical monitoring postop.  She was evaluated by PT and OT on POD#1 and SNF placement was recommended. Patient was instructed on specific activity restrictions and all questions were answered.  She will be discharged to a SNF on POD#2 in stable condition.   Consults: None  Significant Diagnostic Studies: No additional pertinent studies  Treatments: Surgery  Discharge Exam:  Today's Vitals   09/24/23 0111 09/24/23 0156 09/24/23 0520 09/24/23 0734  BP:   (!) 119/53   Pulse:   (!) 55   Resp:   16   Temp:   98.3 F (36.8 C)   TempSrc:      SpO2:   95%   Weight:      Height:      PainSc: 6  0-No pain  10-Worst pain ever   Body mass index is 28.55 kg/m.   Alert.  Answers questions appropriately.  Left short leg splint is clean, dry and intact Exposed toes are warm and well-perfused.  Sensation intact to the exposed toes.  There is no breakdown of the skin at the periphery of the splint.    Disposition:     Allergies as of 09/24/2023   No Known Allergies      Medication List     TAKE these medications    acetaminophen 500 MG tablet Commonly known as: TYLENOL Take 1,000 mg by mouth every 6 (six) hours as needed for mild pain (pain score 1-3).   aspirin EC 81 MG tablet Take 1 tablet (81 mg total) by mouth in the morning and at bedtime.  Swallow whole.   furosemide 40 MG tablet Commonly known as: LASIX Take 40 mg by mouth in the morning.   HYDROcodone-acetaminophen 5-325 MG tablet Commonly known as: NORCO/VICODIN Take 1 tablet by mouth every 6 (six) hours as needed for up to 7 days for moderate pain (pain score 4-6). What changed: reasons to take this   levothyroxine 25 MCG tablet Commonly known as: SYNTHROID Take 25 mcg by mouth daily before breakfast.   Lubricant Eye Drop 0.6 % Soln Generic drug: Propylene Glycol Place 1-2 drops into both eyes 3 (three) times daily as needed (dry/irritated eyes.).   ofloxacin 0.3 % OTIC solution Commonly known as: FLOXIN Place 5 drops into both ears daily.   ondansetron 4 MG tablet Commonly known as: Zofran Take 1 tablet (4 mg total) by mouth every 8 (eight) hours as needed for up to 14 days for nausea or vomiting.   potassium chloride 10 MEQ tablet Commonly known as: KLOR-CON Take 10 mEq by mouth in the morning.   simvastatin 40 MG tablet Commonly known as: ZOCOR Take 40 mg by mouth at bedtime.        Follow-up Information     Oliver Barre, MD Follow up.   Specialties: Orthopedic Surgery, Sports Medicine Why: 10-14 days for suture removal/incision check Contact information: [address] [place] [address] [phone]

## 2023-09-23 NOTE — TOC CM/SW Note (Signed)
 To whom it May Concern:  Please be advised that the above name patient will require a short-term nursing home stay- anticipated 30 days or less rehabilitation and strengthening. The plan is for return home.

## 2023-09-23 NOTE — TOC Progression Note (Addendum)
 Transition of Care [facility]) - Progression Note    Patient Details  Name: Michelle Bradford MRN: [id number] Date of Birth: 08-17-1935  [facility] CM/SW Contact  Isabella Bowens, [place] Phone Number: 09/23/2023, 1:04 PM  Clinical Narrative:    This Clinical research associate spoke with daughter about PT recommendation for SNF. Daughter stated that she was on her way to hospital to see patient and will discuss with her the plans for SNF. Daughter is agreeable and wants her mom to gain her strength back since she does not have help in the home. Daughter wanted patient referral sent to [facility] and [facility]. Referral sent via HUB. TOC to follow.   Addendum 2:52 PM  [facility] admission Engineer, mining, confirmed being able to offer pt a bed. Destiny shared that they can admit tomorrow in the morning. Daughter states that she can provide transportation around 11:00 AM. MD made aware along with daughter. Auth was approved.   Expected Discharge Plan: Home w Home Health Services Barriers to Discharge: No SNF bed  Expected Discharge Plan and Services In-house Referral: Clinical Social Work   Post Acute Care Choice: Durable Medical Equipment Living arrangements for the past 2 months: Mobile Home Expected Discharge Date: 09/23/23               DME Arranged: N/A DME Agency: NA       HH Arranged: NA HH Agency: NA         Social Determinants of Health (SDOH) Interventions SDOH Screenings   Food Insecurity: No Food Insecurity (09/23/2023)  Housing: Unknown (09/23/2023)  Transportation Needs: Patient Declined (09/23/2023)  Utilities: Patient Declined (09/23/2023)  Social Connections: Unknown (09/23/2023)  Tobacco Use: Medium Risk (09/22/2023)    Readmission Risk Interventions     No data to display

## 2023-09-23 NOTE — Discharge Summary (Signed)
 Patient ID: Michelle Bradford MRN: [id number] DOB/AGE: 1936/02/07 88 y.o.  Admit date: 09/22/2023 Discharge date: 09/24/2023  Admission Diagnoses: Left bimalleolar ankle fracture  Discharge Diagnoses:  Principal Problem:   Bimalleolar ankle fracture, left, closed, initial encounter   Past Medical History:  Diagnosis Date   Hyperlipidemia    Hypothyroidism    Osteoporosis    PONV (postoperative nausea and vomiting)      Procedures Performed: ORIF of left ankle fracture  Discharged Condition: good  Hospital Course: Patient brought in as an outpatient for surgery.  Tolerated procedure well.  Was admitted for pain control and medical monitoring postop.  She was evaluated by PT and OT on POD#1 and SNF placement was recommended. Patient was instructed on specific activity restrictions and all questions were answered.  She will be discharged to a SNF on POD#2 in stable condition.   Consults: None  Significant Diagnostic Studies: No additional pertinent studies  Treatments: Surgery  Discharge Exam:  Today's Vitals   09/24/23 0111 09/24/23 0156 09/24/23 0520 09/24/23 0734  BP:   (!) 119/53   Pulse:   (!) 55   Resp:   16   Temp:   98.3 F (36.8 C)   TempSrc:      SpO2:   95%   Weight:      Height:      PainSc: 6  0-No pain  10-Worst pain ever   Body mass index is 28.55 kg/m.   Alert.  Answers questions appropriately.  Left short leg splint is clean, dry and intact Exposed toes are warm and well-perfused.  Sensation intact to the exposed toes.  There is no breakdown of the skin at the periphery of the splint.    Disposition:     Allergies as of 09/24/2023   No Known Allergies      Medication List     TAKE these medications    acetaminophen 500 MG tablet Commonly known as: TYLENOL Take 1,000 mg by mouth every 6 (six) hours as needed for mild pain (pain score 1-3).   aspirin EC 81 MG tablet Take 1 tablet (81 mg total) by mouth in the morning and at bedtime.  Swallow whole.   furosemide 40 MG tablet Commonly known as: LASIX Take 40 mg by mouth in the morning.   HYDROcodone-acetaminophen 5-325 MG tablet Commonly known as: NORCO/VICODIN Take 1 tablet by mouth every 6 (six) hours as needed for up to 7 days for moderate pain (pain score 4-6). What changed: reasons to take this   levothyroxine 25 MCG tablet Commonly known as: SYNTHROID Take 25 mcg by mouth daily before breakfast.   Lubricant Eye Drop 0.6 % Soln Generic drug: Propylene Glycol Place 1-2 drops into both eyes 3 (three) times daily as needed (dry/irritated eyes.).   ofloxacin 0.3 % OTIC solution Commonly known as: FLOXIN Place 5 drops into both ears daily.   ondansetron 4 MG tablet Commonly known as: Zofran Take 1 tablet (4 mg total) by mouth every 8 (eight) hours as needed for up to 14 days for nausea or vomiting.   potassium chloride 10 MEQ tablet Commonly known as: KLOR-CON Take 10 mEq by mouth in the morning.   simvastatin 40 MG tablet Commonly known as: ZOCOR Take 40 mg by mouth at bedtime.        Follow-up Information     Oliver Barre, MD Follow up.   Specialties: Orthopedic Surgery, Sports Medicine Why: 10-14 days for suture removal/incision check Contact information: [address] [place] [address] [phone]

## 2023-09-23 NOTE — Progress Notes (Signed)
   ORTHOPAEDIC PROGRESS NOTE  s/p Procedure(s): Fibulock for left distal fibula fracture   DOS: 09/22/2023  SUBJECTIVE: No issues over night.  She reports she slept well.   OBJECTIVE: PE:  Awake.  Answers questions. She is aware that she is in the hospital  LLE splint is clean, dry and intact Toes are WWP No skin breakdown No bleeding  Vitals:   09/23/23 1323 09/23/23 2028  BP: (!) 149/50 (!) 144/62  Pulse: 62 (!) 58  Resp: 20 20  Temp: 97.9 F (36.6 C) 98.4 F (36.9 C)  SpO2: 99% 96%     ASSESSMENT: Michelle Bradford is a 88 y.o. female doing well postoperatively.  PLAN: Weightbearing: NWB LLE Incisional and dressing care: Dressings left intact until follow-up Orthopedic device(s): Splint to remain  clean, dry and intact VTE prophylaxis: Aspirin 81 mg BID x 6 weeks Pain control: As needed Follow - up plan: 2 weeks  Awaiting PT/OT evaluation; HH PT v SNF placement   Contact information:     Saffron Busey A. Dallas Schimke, MD MS [facility] [address] [place],  [place]  [address] Phone: [phone] Fax: [phone]

## 2023-09-23 NOTE — Evaluation (Addendum)
 Physical Therapy Evaluation Patient Details Name: Michelle Bradford MRN: [id number] DOB: Dec 14, 1935 Today's Date: 09/23/2023  History of Present Illness  Michelle Bradford is a 88 y.o. female who presents for evaluation of left ankle pain.  She initially presented to the urgent care at [facility] after injuring her left ankle.  She slipped and fell.  She twisted the ankle.  She immediate pain.  At the urgent care, radiographs demonstrated a fracture.  She lives in [place], and was subsequently referred to our clinic.  She presents to clinic with her daughter.  Since injuring her left ankle, she has been taking hydrocodone.   Clinical Impression  Patient tolerated session well on this date. Patient with good carryover of NWB precautions on LLE status post-ORIF t/o session. On this date, patient was mod(I) for bed mobility w/ HOB elevated and RW (supine<>sit), min A-CGA for transfers bed<>BSC<>recliner. Patient demonstrating mild unsteadiness during transfers.  Pt with good carryover of foot placement and DME use to comply with NWB precautions. Patient reports previously being (I) with all mobility, ADLs, and iADLs. Patient has a roommate that she reports having to assist at times and would not be able to assist patient physically. Patient with generalized weakness t/o. Patient reports only having RW at home. Patient will benefit from continued skilled physical therapy acutely and in recommended venue in order to address her deficits before returning home for safe mobility once home and return to PLOF. Explained this recommendation being for safety to patient. She expressed understanding but reports she would prefer home. If going home, patient may need BSC and/or shower chair for safety.       If plan is discharge home, recommend the following: A little help with walking and/or transfers;A little help with bathing/dressing/bathroom   Can travel by private vehicle   Yes    Equipment Recommendations     Recommendations for Other Services       Functional Status Assessment Patient has had a recent decline in their functional status and demonstrates the ability to make significant improvements in function in a reasonable and predictable amount of time.     Precautions / Restrictions Precautions Precautions: Fall Recall of Precautions/Restrictions: Intact Required Braces or Orthoses: Splint/Cast Splint/Cast: L ankle Restrictions LLE Weight Bearing Per Provider Order: Non weight bearing Other Position/Activity Restrictions: NWB LLE      Mobility  Bed Mobility Overal bed mobility: Modified Independent             General bed mobility comments: HOB elevated, labored movement. Use of R railing to sit EOB    Transfers Overall transfer level: Modified independent Equipment used: Rolling walker (2 wheels) Transfers: Sit to/from Stand, Bed to chair/wheelchair/BSC Sit to Stand: Modified independent (Device/Increase time) Stand pivot transfers: Contact guard assist, Min assist         General transfer comment: w/ RW. STS from bed and BSC. Inc time 2/2 weakness and pain. Bed>BSC>recliner min A-CGA for mild unsteadiness with RW    Ambulation/Gait               General Gait Details: Not assessed on this date 2/2 safety and pt fatigue/pain level  Stairs            Wheelchair Mobility     Tilt Bed    Modified Rankin (Stroke Patients Only)       Balance Overall balance assessment: Needs assistance Sitting-balance support: Bilateral upper extremity supported, No upper extremity supported, Feet supported Sitting balance-Leahy Scale: Good Sitting  balance - Comments: Seated EOB   Standing balance support: During functional activity, Reliant on assistive device for balance, Bilateral upper extremity supported Standing balance-Leahy Scale: Fair Standing balance comment: w/ RW, and LLE in air for NWB prec                             Pertinent  Vitals/Pain Pain Assessment Pain Assessment: 0-10 Pain Score: 6  Pain Descriptors / Indicators: Burning Pain Intervention(s): Limited activity within patient's tolerance, Monitored during session, Repositioned    Home Living Family/patient expects to be discharged to:: Private residence Living Arrangements: Non-relatives/Friends (Has one roommate) Available Help at Discharge: Friend(s);Available 24 hours/day (Not able to assist physically if needed) Type of Home: Mobile home Home Access: Ramped entrance       Home Layout: One level Home Equipment: Rolling Walker (2 wheels) Additional Comments: Reports roommate may have shower seat or BSC available. Unsure.    Prior Function Prior Level of Function : Independent/Modified Independent             Mobility Comments: Household and community ambulator. No AD use. ADLs Comments: (I) with all ADLs.     Extremity/Trunk Assessment   Upper Extremity Assessment Upper Extremity Assessment: Defer to OT evaluation (Shoulde flex [facility]. 4- MMT)    Lower Extremity Assessment Lower Extremity Assessment: Generalized weakness;LLE deficits/detail;RLE deficits/detail;Overall WFL for tasks assessed RLE Deficits / Details: generalized weakness, 3+/4- MMT RLE Coordination: decreased gross motor LLE Deficits / Details: LLE NWB status. Pt able to move LLE to EOB with min A intiially, progressing to (I). 3/5 at best LLE: Unable to fully assess due to immobilization LLE Coordination: decreased gross motor;decreased fine motor    Cervical / Trunk Assessment Cervical / Trunk Assessment: Normal  Communication   Communication Communication: No apparent difficulties Factors Affecting Communication:  (HOH. Hearing aids were charging.)    Cognition Arousal: Alert Behavior During Therapy: WFL for tasks assessed/performed   PT - Cognitive impairments: No apparent impairments                         Following commands: Intact        Cueing Cueing Techniques: Verbal cues     General Comments      Exercises     Assessment/Plan    PT Assessment Patient needs continued PT services;All further PT needs can be met in the next venue of care  PT Problem List Decreased strength;Decreased range of motion;Decreased activity tolerance;Decreased balance;Decreased mobility;Pain;Decreased coordination       PT Treatment Interventions DME instruction;Gait training;Patient/family education;Functional mobility training;Therapeutic activities;Therapeutic exercise;Balance training    PT Goals (Current goals can be found in the Care Plan section)  Acute Rehab PT Goals Patient Stated Goal: Return home PT Goal Formulation: With patient Time For Goal Achievement: 09/30/23 Potential to Achieve Goals: Fair    Frequency Min 3X/week     Co-evaluation               AM-PAC PT "6 Clicks" Mobility  Outcome Measure Help needed turning from your back to your side while in a flat bed without using bedrails?: None Help needed moving from lying on your back to sitting on the side of a flat bed without using bedrails?: A Little Help needed moving to and from a bed to a chair (including a wheelchair)?: A Little Help needed standing up from a chair using your arms (e.g., wheelchair or  bedside chair)?: A Little Help needed to walk in hospital room?: A Little Help needed climbing 3-5 steps with a railing? : A Lot 6 Click Score: 18    End of Session Equipment Utilized During Treatment: Gait belt Activity Tolerance: Patient tolerated treatment well;Patient limited by pain;Patient limited by fatigue Patient left: with call bell/phone within reach;in chair Nurse Communication: Mobility status PT Visit Diagnosis: Unsteadiness on feet (R26.81);Difficulty in walking, not elsewhere classified (R26.2);Other abnormalities of gait and mobility (R26.89);History of falling (Z91.81);Muscle weakness (generalized) (M62.81);Pain Pain - Right/Left:  Left Pain - part of body: Ankle and joints of foot    Time: 0935-1001 PT Time Calculation (min) (ACUTE ONLY): 26 min   Charges:   PT Evaluation $PT Eval Low Complexity: 1 Low PT Treatments $Gait Training: 23-37 mins $Therapeutic Activity: 23-37 mins PT General Charges $$ ACUTE PT VISIT: 1 Visit         11:19 AM, 09/23/23 Chryl Heck, PT, DPT [place] with [facility]

## 2023-09-23 NOTE — NC FL2 (Signed)
 [place] MEDICAID FL2 LEVEL OF CARE FORM     IDENTIFICATION  Patient Name: Michelle Bradford Birthdate: 1935/06/26 Sex: female Admission Date (Current Location): 09/22/2023  [facility] and [place] Number:  [employer] and Address:  [facility],  [address] [place] [address]      Provider Number: [id number]  Attending Physician Name and Address:  Oliver Barre, MD  Relative Name and Phone Number:  Eulogio Bear [phone]    Current Level of Care: Hospital Recommended Level of Care: Skilled Nursing Facility Prior Approval Number:    Date Approved/Denied:   PASRR Number: Pending  Discharge Plan: SNF    Current Diagnoses: Patient Active Problem List   Diagnosis Date Noted   Bimalleolar ankle fracture, left, closed, initial encounter 09/22/2023   Enterocele 11/11/2012   SHOULDER PAIN 09/06/2009   ELBOW PAIN, LEFT 09/06/2009   IMPINGEMENT SYNDROME 09/06/2009   Lateral epicondylitis 09/06/2009    Orientation RESPIRATION BLADDER Height & Weight     Self, Place  Normal Continent Weight: 156 lb 1.4 oz (70.8 kg) Height:  5\' 2"  (157.5 cm)  BEHAVIORAL SYMPTOMS/MOOD NEUROLOGICAL BOWEL NUTRITION STATUS      Continent Diet (See DC summary)  AMBULATORY STATUS COMMUNICATION OF NEEDS Skin   Extensive Assist Verbally Other (Comment) (Incisions; Left ankle- Gauze-Abdominal pads)                       Personal Care Assistance Level of Assistance  Bathing, Feeding, Dressing Bathing Assistance: Maximum assistance Feeding assistance: Independent Dressing Assistance: Maximum assistance     Functional Limitations Info  Sight, Hearing, Speech Sight Info: Adequate Hearing Info: Impaired (Hearing aides) Speech Info: Adequate    SPECIAL CARE FACTORS FREQUENCY  PT (By licensed PT), OT (By licensed OT)     PT Frequency: 5 x a week OT Frequency: 5 x a week            Contractures Contractures Info: Not present    Additional Factors Info   Code Status, Allergies Code Status Info: FULL Allergies Info: NKA           Current Medications (09/23/2023):  This is the current hospital active medication list Current Facility-Administered Medications  Medication Dose Route Frequency Provider Last Rate Last Admin   acetaminophen (TYLENOL) tablet 325-650 mg  325-650 mg Oral Q6H PRN Oliver Barre, MD   650 mg at 09/23/23 0908   diphenhydrAMINE (BENADRYL) 12.5 MG/5ML elixir 12.5-25 mg  12.5-25 mg Oral Q4H PRN Oliver Barre, MD       furosemide (LASIX) tablet 40 mg  40 mg Oral q AM Oliver Barre, MD   40 mg at 09/23/23 5621   HYDROcodone-acetaminophen (NORCO/VICODIN) 5-325 MG per tablet 1-2 tablet  1-2 tablet Oral Q4H PRN Oliver Barre, MD   1 tablet at 09/23/23 3086   levothyroxine (SYNTHROID) tablet 25 mcg  25 mcg Oral QAC breakfast Oliver Barre, MD   25 mcg at 09/23/23 5784   morphine (PF) 2 MG/ML injection 0.5-1 mg  0.5-1 mg Intravenous Q2H PRN Oliver Barre, MD   1 mg at 09/22/23 1513   ofloxacin (OCUFLOX) 0.3 % ophthalmic solution 5 drop  5 drop Both EARS Daily Oliver Barre, MD   5 drop at 09/23/23 0908   ondansetron (ZOFRAN) tablet 4 mg  4 mg Oral Q6H PRN Oliver Barre, MD       Or   ondansetron [facility]) injection 4  mg  4 mg Intravenous Q6H PRN Oliver Barre, MD       simvastatin (ZOCOR) tablet 40 mg  40 mg Oral QHS Oliver Barre, MD   40 mg at 09/22/23 2149     Discharge Medications: Please see discharge summary for a list of discharge medications.  Relevant Imaging Results:  Relevant Lab Results:   Additional Information SSN: [id number]  Isabella Bowens, [place]

## 2023-09-23 NOTE — Plan of Care (Addendum)
  Problem: Acute Rehab PT Goals(only PT should resolve) Goal: Pt Will Go Supine/Side To Sit Outcome: Progressing Flowsheets (Taken 09/23/2023 1124) Pt will go Supine/Side to Sit: Independently Goal: Patient Will Transfer Sit To/From Stand Outcome: Progressing Flowsheets (Taken 09/23/2023 1124) Patient will transfer sit to/from stand: with modified independence Goal: Pt Will Transfer Bed To Chair/Chair To Bed Outcome: Progressing Flowsheets (Taken 09/23/2023 1124) Pt will Transfer Bed to Chair/Chair to Bed: with modified independence Goal: Pt Will Ambulate Outcome: Progressing Flowsheets (Taken 09/23/2023 1124) Pt will Ambulate:  10 feet  with modified independence  with rolling walker   11:24 AM, 09/23/23 Chryl Heck, PT, DPT [place] with [facility]

## 2023-09-24 ENCOUNTER — Encounter (HOSPITAL_COMMUNITY): Payer: Self-pay | Admitting: Orthopedic Surgery

## 2023-09-24 ENCOUNTER — Telehealth: Payer: Self-pay | Admitting: Orthopedic Surgery

## 2023-09-24 DIAGNOSIS — M810 Age-related osteoporosis without current pathological fracture: Secondary | ICD-10-CM | POA: Insufficient documentation

## 2023-09-24 DIAGNOSIS — E785 Hyperlipidemia, unspecified: Secondary | ICD-10-CM | POA: Insufficient documentation

## 2023-09-24 DIAGNOSIS — E039 Hypothyroidism, unspecified: Secondary | ICD-10-CM | POA: Insufficient documentation

## 2023-09-24 DIAGNOSIS — S82842A Displaced bimalleolar fracture of left lower leg, initial encounter for closed fracture: Secondary | ICD-10-CM | POA: Diagnosis not present

## 2023-09-24 DIAGNOSIS — Z9889 Other specified postprocedural states: Secondary | ICD-10-CM | POA: Insufficient documentation

## 2023-09-24 NOTE — Telephone Encounter (Signed)
 Dr. Dallas Schimke pt Michelle Bradford the admissions nurse at [facility] [phone], she needs clarification on orders regarding her dressing.

## 2023-09-24 NOTE — Evaluation (Signed)
 Occupational Therapy Evaluation Patient Details Name: Michelle Bradford MRN: [id number] DOB: [birth year] Today's Date: 09/24/2023   History of Present Illness   Michelle Bradford is a 88 y.o. female who presents for evaluation of left ankle pain.  She initially presented to the urgent care at [facility] after injuring her left ankle.  She slipped and fell.  She twisted the ankle.  She immediate pain.  At the urgent care, radiographs demonstrated a fracture.  She lives in [place], and was subsequently referred to our clinic.  She presents to clinic with her daughter.  Since injuring her left ankle, she has been taking hydrocodone.     Clinical Impressions Pt agreeable to OT evaluation. Pt demonstrated need for CGA to min A for SPT to bed from chair and back with RW. Good ability to maintain NWB status with L LE. B UE generally weak. Good ability to complete upper body ADL's. Assist needed for lower body L LE ADL tasks. Pt left in the chair with call bell within reach and friend present. Pt will benefit from continued OT in the hospital and recommended venue below to increase strength, balance, and endurance for safe ADL's.        If plan is discharge home, recommend the following:   A little help with walking and/or transfers;A lot of help with bathing/dressing/bathroom;Assistance with cooking/housework;Assist for transportation;Help with stairs or ramp for entrance     Functional Status Assessment   Patient has had a recent decline in their functional status and demonstrates the ability to make significant improvements in function in a reasonable and predictable amount of time.     Equipment Recommendations   None recommended by OT             Precautions/Restrictions   Precautions Precautions: Fall Recall of Precautions/Restrictions: Intact Required Braces or Orthoses: Splint/Cast Splint/Cast: L ankle Restrictions Weight Bearing Restrictions Per Provider Order: Yes LLE  Weight Bearing Per Provider Order: Non weight bearing Other Position/Activity Restrictions: NWB LLE     Mobility Bed Mobility               General bed mobility comments: Pt up in the chair to start the session.    Transfers Overall transfer level: Needs assistance Equipment used: Rolling walker (2 wheels) Transfers: Sit to/from Stand, Bed to chair/wheelchair/BSC Sit to Stand: Contact guard assist, Min assist Stand pivot transfers: Contact guard assist, Min assist         General transfer comment: Using RW pt was able to scoot with R LE while holding RW. Good ability to maitnain NWB status with L LE. Min A needed mostly for sit to stand by holding RW for stability and at times ensuring safe use of RW during transfer.      Balance Overall balance assessment: Needs assistance Sitting-balance support: No upper extremity supported, Feet supported Sitting balance-Leahy Scale: Good Sitting balance - Comments: Seated EOB   Standing balance support: During functional activity, Reliant on assistive device for balance, Bilateral upper extremity supported Standing balance-Leahy Scale: Fair Standing balance comment: using RW                           ADL either performed or assessed with clinical judgement   ADL Overall ADL's : Needs assistance/impaired     Grooming: Set up;Sitting   Upper Body Bathing: Set up;Sitting   Lower Body Bathing: Moderate assistance;Sitting/lateral leans   Upper Body Dressing : Set up;Sitting  Lower Body Dressing: Moderate assistance;Sitting/lateral leans Lower Body Dressing Details (indicate cue type and reason): Pt able to doff and don R sock; assist needed at this time for L LE dressing due to fracture and cast. 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) Ability to See in Adequate Light: 2 Moderately impaired Patient Visual Report: No change from baseline Vision Assessment?: No apparent visual deficits Additional Comments: other than baseline issues     Perception Perception: Not tested       Praxis Praxis: Not tested       Pertinent Vitals/Pain Pain Assessment Pain Assessment: No/denies pain     Extremity/Trunk Assessment Upper Extremity Assessment Upper Extremity Assessment: Generalized weakness   Lower Extremity Assessment Lower Extremity Assessment: Defer to PT evaluation   Cervical / Trunk Assessment Cervical / Trunk Assessment: Normal   Communication Communication Communication: Impaired Factors Affecting Communication: Hearing impaired   Cognition Arousal: Alert Behavior During Therapy: WFL for tasks assessed/performed Cognition: No apparent impairments                               Following commands: Intact       Cueing  General Comments   Cueing Techniques: Verbal cues                 Home Living Family/patient expects to be discharged to:: Private residence Living Arrangements: Non-relatives/Friends Available Help at Discharge: Friend(s);Available 24 hours/day Type of Home: Mobile home Home Access: Ramped entrance     Home Layout: One level     Bathroom Shower/Tub: Chief Strategy Officer: Standard Bathroom Accessibility: Yes How Accessible: Accessible via walker Home Equipment: Rolling Walker (2 wheels)   Additional Comments: per PT note      Prior Functioning/Environment Prior Level of Function : Independent/Modified Independent             Mobility Comments: Household and community ambulator. No AD use. ADLs Comments: Independent    OT Problem List: Impaired  balance (sitting and/or standing);Decreased strength   OT Treatment/Interventions: Self-care/ADL training;Therapeutic exercise;Therapeutic activities;Patient/family education;Balance training;DME and/or AE instruction      OT Goals(Current goals can be found in the care plan section)   Acute Rehab OT Goals Patient Stated Goal: improve function OT Goal Formulation: With patient Time For Goal Achievement: 10/08/23 Potential to Achieve Goals: Good   OT Frequency:  Min 2X/week                  AM-PAC OT "6 Clicks" Daily Activity     Outcome Measure Help from another person eating meals?: None Help from another person taking care of personal grooming?: A Little Help from another person toileting, which includes using toliet, bedpan, or urinal?: A Little Help from another person bathing (including washing, rinsing, drying)?: A Lot Help from another person to put on and taking off regular upper body clothing?: A Little Help from another person to put on and taking off regular lower body clothing?: A Lot 6 Click Score: 17   End of Session Equipment Utilized During Treatment: Rolling walker (2 wheels)  Activity Tolerance: Patient tolerated treatment  well Patient left: in chair;with call bell/phone within reach;with family/visitor present  OT Visit Diagnosis: Unsteadiness on feet (R26.81);Other abnormalities of gait and mobility (R26.89);Muscle weakness (generalized) (M62.81)                Time: 0865-7846 OT Time Calculation (min): 9 min Charges:  OT General Charges $OT Visit: 1 Visit OT Evaluation $OT Eval Low Complexity: 1 Low  Peightyn Roberson OT, MOT  Danie Chandler 09/24/2023, 9:33 AM

## 2023-09-24 NOTE — Plan of Care (Signed)
  Problem: Acute Rehab OT Goals (only OT should resolve) Goal: Pt. Will Perform Lower Body Bathing Flowsheets (Taken 09/24/2023 0935) Pt Will Perform Lower Body Bathing: with modified independence Goal: Pt. Will Perform Lower Body Dressing Flowsheets (Taken 09/24/2023 0935) Pt Will Perform Lower Body Dressing: with modified independence Goal: Pt. Will Transfer To Toilet Flowsheets (Taken 09/24/2023 0935) Pt Will Transfer to Toilet: with modified independence Goal: Pt. Will Perform Toileting-Clothing Manipulation Flowsheets (Taken 09/24/2023 0935) Pt Will Perform Toileting - Clothing Manipulation and hygiene: with modified independence Goal: Pt/Caregiver Will Perform Home Exercise Program Flowsheets (Taken 09/24/2023 0935) Pt/caregiver will Perform Home Exercise Program:  Increased strength  Independently  Both right and left upper extremity  Theodore Virgin OT, MOT

## 2023-09-24 NOTE — Plan of Care (Signed)
°  Problem: Clinical Measurements: Goal: Will remain free from infection Outcome: Progressing   Problem: Activity: Goal: Risk for activity intolerance will decrease Outcome: Progressing   Problem: Nutrition: Goal: Adequate nutrition will be maintained Outcome: Progressing

## 2023-09-24 NOTE — TOC Transition Note (Signed)
 Transition of Care [facility]) - Discharge Note   Patient Details  Name: Michelle Bradford MRN: [id number] Date of Birth: 22-Dec-1935  [facility]) CM/SW Contact:  Isabella Bowens, LCSWA Phone Number: 09/24/2023, 8:50 AM   Clinical Narrative:     Patient is scheduled to DC today. [facility] admission Director Destiny reached out to CSW this morning with room number and call for report. Nurse and MD updated. DC summary sent via HUB. TOC signing off.    Final next level of care: Skilled Nursing Facility Barriers to Discharge: Barriers Resolved   Patient Goals and CMS Choice Patient states their goals for this hospitalization and ongoing recovery are:: Go to SNF for short-term rehab CMS Medicare.gov Compare Post Acute Care list provided to:: Patient Represenative (must comment) (Daughter-Marie) Choice offered to / list presented to : Adult Children, Patient  ownership interest in [facility].provided to:: Adult Children    Discharge Placement              Patient chooses bed at:  [facility]) Patient to be transferred to facility by: Daughter Hilda Lias Name of family member notified: Hilda Lias and patient Patient and family notified of of transfer: 09/24/23  Discharge Plan and Services Additional resources added to the After Visit Summary for   In-house Referral: Clinical Social Work   Post Acute Care Choice: Durable Medical Equipment          DME Arranged: N/A DME Agency: NA       HH Arranged: NA HH Agency: NA        Social Drivers of Health (SDOH) Interventions SDOH Screenings   Food Insecurity: No Food Insecurity (09/23/2023)  Housing: Unknown (09/23/2023)  Transportation Needs: Patient Declined (09/23/2023)  Utilities: Patient Declined (09/23/2023)  Social Connections: Unknown (09/23/2023)  Tobacco Use: Medium Risk (09/22/2023)     Readmission Risk Interventions     No data to display

## 2023-09-25 NOTE — Telephone Encounter (Signed)
 Called and let facility know to leave bandage in place. We will remove it and provide further instructions. Kesha verbalized understanding.

## 2023-10-08 ENCOUNTER — Ambulatory Visit: Admitting: Orthopedic Surgery

## 2023-10-08 ENCOUNTER — Encounter: Payer: Self-pay | Admitting: Orthopedic Surgery

## 2023-10-08 ENCOUNTER — Other Ambulatory Visit (INDEPENDENT_AMBULATORY_CARE_PROVIDER_SITE_OTHER): Payer: Self-pay

## 2023-10-08 DIAGNOSIS — S82842D Displaced bimalleolar fracture of left lower leg, subsequent encounter for closed fracture with routine healing: Secondary | ICD-10-CM | POA: Diagnosis not present

## 2023-10-08 DIAGNOSIS — Z9889 Other specified postprocedural states: Secondary | ICD-10-CM

## 2023-10-08 DIAGNOSIS — Z8781 Personal history of (healed) traumatic fracture: Secondary | ICD-10-CM

## 2023-10-08 NOTE — Patient Instructions (Signed)
 Weightbearing as tolerated in the walking boot.  Please wear the boot at all times.  Continue to take aspirin  twice daily  Elevate to help with swelling  Medications as needed  Follow-up in 2 weeks

## 2023-10-08 NOTE — Progress Notes (Signed)
 Orthopaedic Postop Note  Assessment: Michelle Bradford is a 88 y.o. female s/p ORIF of Left ankle fracture; fibulock distal fibula  DOS: 09/22/23  Plan: Sutures trimmed, steri strips placed She was fitted with a walking boot Weightbearing as tolerated on the left leg, recommend the use of a walker Continue to take Aspirin  81 mg BID Return to clinic in 2 weeks   Follow-up: Return in about 2 weeks (around 10/22/2023). XR at next visit: Left ankle  Subjective:  Chief Complaint  Patient presents with   Routine Post Op    L ankle DOS 09/22/23    History of Present Illness: Michelle Bradford is a 88 y.o. female who presents following the above stated procedure.  Surgery was approximately 2 weeks ago.  She has done well.  She remains at [facility] rehab in [place].  She is not having any pain.  No numbness or tingling.  She feels better.  She is hoping to return home very soon.  Review of Systems: No fevers or chills No numbness or tingling No Chest Pain No shortness of breath   Objective: There were no vitals taken for this visit.  Physical Exam:  Alert and oriented.  No acute distress.  Seated in a wheelchair  Lateral base surgical incision is healing.  No surrounding erythema or drainage.  Swelling is much better.  She does continue to have some soft tissue swelling over the lateral ankle.  Toes warm and well-perfused.  Active motion intact in the EHL/TA.  She responds to light touch over the dorsum of the foot.  IMAGING: I personally ordered and reviewed the following images   X-rays left ankle were obtained in clinic today.  No acute injuries noted.  Distal fibula fracture remains in good alignment.  Hardware remains intact.  Screws are not backing out.  There is been no subsidence.  No hardware failure.  No medial clear space widening.  The mortise is congruent.  Impression: Healing left distal fibula fracture without hardware failure   Tonita Frater, MD 10/08/2023 9:14 AM

## 2023-10-13 ENCOUNTER — Telehealth: Payer: Self-pay | Admitting: Orthopedic Surgery

## 2023-10-13 NOTE — Telephone Encounter (Signed)
 I called with verbal orders left message

## 2023-10-13 NOTE — Telephone Encounter (Signed)
 Dr. Ernesta Heading pt - Leegie? PT w/[facility] [phone] lvm requesting verbal orders for this pt to have HH PT 1w4w.

## 2023-10-14 ENCOUNTER — Telehealth: Payer: Self-pay | Admitting: Orthopedic Surgery

## 2023-10-14 NOTE — Telephone Encounter (Signed)
 Dr. Ernesta Heading pt - Leegie? PT w/[facility] [phone] lvm requesting weight bearing status and something about will she remain in that status?

## 2023-10-14 NOTE — Telephone Encounter (Signed)
 WBAT in boot until follow up 2 weeks I called Leegie to discuss

## 2023-10-17 ENCOUNTER — Telehealth: Payer: Self-pay | Admitting: Orthopedic Surgery

## 2023-10-17 NOTE — Telephone Encounter (Signed)
 Moira Andrews OT PT w/[facility] [phone] lvm stating she called the pt to schedule her evaluation and the pt is requesting to delay that until 5/16 bc she is going OOT and the pt's schedule was full today.  Moira Andrews is requesting verbal orders to delay the [facility] OT evaluation.

## 2023-10-20 NOTE — Telephone Encounter (Signed)
 I called Michelle Bradford to advise delay noted Also gave verbal orders

## 2023-10-21 ENCOUNTER — Encounter: Admitting: Orthopedic Surgery

## 2023-10-22 ENCOUNTER — Encounter: Admitting: Orthopedic Surgery

## 2023-10-28 ENCOUNTER — Encounter: Admitting: Orthopedic Surgery

## 2023-10-29 ENCOUNTER — Other Ambulatory Visit (INDEPENDENT_AMBULATORY_CARE_PROVIDER_SITE_OTHER): Payer: Self-pay

## 2023-10-29 ENCOUNTER — Ambulatory Visit (INDEPENDENT_AMBULATORY_CARE_PROVIDER_SITE_OTHER): Admitting: Orthopedic Surgery

## 2023-10-29 DIAGNOSIS — S82842D Displaced bimalleolar fracture of left lower leg, subsequent encounter for closed fracture with routine healing: Secondary | ICD-10-CM | POA: Diagnosis not present

## 2023-10-29 DIAGNOSIS — Z9889 Other specified postprocedural states: Secondary | ICD-10-CM

## 2023-10-29 DIAGNOSIS — Z8781 Personal history of (healed) traumatic fracture: Secondary | ICD-10-CM

## 2023-10-29 NOTE — Progress Notes (Unsigned)
 Orthopaedic Postop Note  Assessment: Michelle Bradford is a 88 y.o. female s/p ORIF of Left ankle fracture; fibulock distal fibula  DOS: 09/22/23  Plan: Sutures trimmed, steri strips placed She was fitted with a walking boot Weightbearing as tolerated on the left leg, recommend the use of a walker Continue to take Aspirin  81 mg BID Return to clinic in 2 weeks   Follow-up: Return in about 4 weeks (around 11/26/2023). XR at next visit: Left ankle  Subjective:  Chief Complaint  Patient presents with   Routine Post Op    L ankle DOS 09/22/23    History of Present Illness: Michelle Bradford is a 88 y.o. female who presents following the above stated procedure.  Surgery was approximately 2 weeks ago.  She has done well.  She remains at [facility] rehab in [place].  She is not having any pain.  No numbness or tingling.  She feels better.  She is hoping to return home very soon.  Review of Systems: No fevers or chills No numbness or tingling No Chest Pain No shortness of breath   Objective: There were no vitals taken for this visit.  Physical Exam:  Alert and oriented.  No acute distress.  Seated in a wheelchair  Lateral base surgical incision is healing.  No surrounding erythema or drainage.  Swelling is much better.  She does continue to have some soft tissue swelling over the lateral ankle.  Toes warm and well-perfused.  Active motion intact in the EHL/TA.  She responds to light touch over the dorsum of the foot.  IMAGING: I personally ordered and reviewed the following images   X-rays left ankle were obtained in clinic today.  No acute injuries noted.  Distal fibula fracture remains in good alignment.  Hardware remains intact.  Screws are not backing out.  There is been no subsidence.  No hardware failure.  No medial clear space widening.  The mortise is congruent.  Impression: Healing left distal fibula fracture without hardware failure   Tonita Frater, MD 10/29/2023 3:25 PM

## 2023-10-29 NOTE — Patient Instructions (Signed)
 Okay to bear weight on your left foot, while wearing the boot.  Work with physical therapy.  No restrictions.   Okay to remove the boot, and work on range of motion.  Okay to remove the boot for hygiene.

## 2023-10-30 ENCOUNTER — Telehealth: Payer: Self-pay | Admitting: Orthopedic Surgery

## 2023-10-30 ENCOUNTER — Encounter: Payer: Self-pay | Admitting: Orthopedic Surgery

## 2023-10-30 NOTE — Telephone Encounter (Signed)
 Called and verbal order given to Amy.

## 2023-10-30 NOTE — Telephone Encounter (Signed)
 Michelle Bradford w/[facility] [phone] lvm requesting verbal orders to extend her PT 1w2 and 2w3 to continue to work on her overall strength, transfer, gait, balance and safety and set her up with an at home exercise program.

## 2023-10-30 NOTE — Telephone Encounter (Signed)
 Michelle Bradford

## 2023-11-26 ENCOUNTER — Encounter: Admitting: Orthopedic Surgery

## 2023-11-28 ENCOUNTER — Telehealth: Payer: Self-pay | Admitting: Orthopedic Surgery

## 2023-11-28 NOTE — Telephone Encounter (Signed)
 Returned the call to [phone] regarding rs an appt, lvm

## 2023-12-08 ENCOUNTER — Telehealth: Payer: Self-pay | Admitting: Orthopedic Surgery

## 2023-12-08 NOTE — Telephone Encounter (Signed)
 Dr. Onesimo pt - someone from [facility] [phone] lvm on 12/05/23 at 1:02pm stating the pt missed her appointment on 11/26/23.  She has been D/C from PT on 12/05/23, he saw her that morning.  She is having significant swelling around the left ankle, she hasn't been taking her Lasix  like she's supposed to.  Her lt ankle is very limited in motion and she is having a lot of pain and sensitivity.  He has encouraged the family to schedule a f/u appt asap.

## 2023-12-09 ENCOUNTER — Telehealth: Payer: Self-pay | Admitting: Orthopedic Surgery

## 2023-12-09 NOTE — Telephone Encounter (Signed)
 Returned a call to [employer], she lvm very upset that the pt came in for her appointment today and was not see.  I take full responsibility of the error, I had told the pt to come in bc she was having an issue.  I thought I scheduled her for today, but the computer jumped to 12/16/23 and I had no idea it did that until [facility] called.  I called Jon back and repeatedly apologized.  I explained that Dr. Fanny nurse was consulted when she came in this morning and she stated the would be fine to wait until next Tuesday.  Jon verbalized understanding and was fine.

## 2023-12-16 ENCOUNTER — Other Ambulatory Visit (INDEPENDENT_AMBULATORY_CARE_PROVIDER_SITE_OTHER): Payer: Self-pay

## 2023-12-16 ENCOUNTER — Encounter: Payer: Self-pay | Admitting: Orthopedic Surgery

## 2023-12-16 ENCOUNTER — Ambulatory Visit: Admitting: Orthopedic Surgery

## 2023-12-16 DIAGNOSIS — Z8781 Personal history of (healed) traumatic fracture: Secondary | ICD-10-CM | POA: Diagnosis not present

## 2023-12-16 DIAGNOSIS — Z9889 Other specified postprocedural states: Secondary | ICD-10-CM

## 2023-12-16 NOTE — Progress Notes (Signed)
 Orthopaedic Postop Note  Assessment: Michelle Bradford is a 88 y.o. female s/p ORIF of Left ankle fracture; fibulock distal fibula  DOS: 09/22/23  Plan: Michelle Bradford.  Radiographs are stable.  She has healed her fracture.  She is ambulating well with the assistance of a cane.  She does have some swelling today, but this is likely combination of normal swelling following surgery, as well as the fact that she has not been taking appropriate medications.  I have urged her to get a refill on her fluid pills.  Continue to ambulate to get stronger.  She will contact clinic if she has any issues.  Follow-up: Return if symptoms worsen or fail to improve. XR at next visit: Left ankle  Subjective:  Chief Complaint  Patient presents with   Routine Post Op    L ankle DOS: 09/22/23    History of Present Illness: Michelle Bradford is a 88 y.o. female who presents following the above stated procedure.  Surgery was approximately 3 months ago.  She has done well.  Takes occasional Tylenol .  Cane to assist with ambulation.  She has some swelling, but has not been taking her fluid pills for the past 2 months.  She has been working with home health physical therapy.  She has been taking some steps without a cane.   Review of Systems: No fevers or chills No numbness or tingling No Chest Pain No shortness of breath   Objective: There were no vitals taken for this visit.  Physical Exam:  Alert and oriented.  No acute distress.  Steady gait using a cane.  Lateral surgical incision is healed.  No surrounding erythema or drainage.  Diffuse swelling from the mid tibia distally.  There is some pitting edema.  No redness or drainage.  Sensation intact to the dorsum of the foot.  She tolerates gentle range of motion.  She can get to a plantigrade position.   IMAGING: I personally ordered and reviewed the following images   X-rays of the left ankle were obtained in clinic today.  These are compared to available  x-rays.  Hardware in the distal fibula remains in a stable position.  Screws not backing out.  Distal fibula fracture has healed.  The small medial malleolus avulsion fracture is in stable alignment.  Mortise is congruent.  No syndesmotic disruption.  No new injuries.  Impression: [name]   Michelle DELENA Horde, MD 12/16/2023 8:40 AM

## 2024-04-30 ENCOUNTER — Other Ambulatory Visit (HOSPITAL_COMMUNITY): Payer: Self-pay | Admitting: Internal Medicine

## 2024-04-30 DIAGNOSIS — Z1231 Encounter for screening mammogram for malignant neoplasm of breast: Secondary | ICD-10-CM

## 2024-05-21 ENCOUNTER — Ambulatory Visit (HOSPITAL_COMMUNITY)

## 2024-05-31 ENCOUNTER — Inpatient Hospital Stay (HOSPITAL_COMMUNITY): Admission: RE | Admit: 2024-05-31 | Source: Ambulatory Visit

## 2024-06-25 ENCOUNTER — Encounter (HOSPITAL_COMMUNITY): Payer: Self-pay

## 2024-06-25 ENCOUNTER — Ambulatory Visit (HOSPITAL_COMMUNITY)
Admission: RE | Admit: 2024-06-25 | Discharge: 2024-06-25 | Disposition: A | Source: Ambulatory Visit | Attending: Internal Medicine | Admitting: Internal Medicine

## 2024-06-25 DIAGNOSIS — Z1231 Encounter for screening mammogram for malignant neoplasm of breast: Secondary | ICD-10-CM | POA: Diagnosis present
